# Patient Record
Sex: Male | Born: 1950
Health system: Southern US, Community
[De-identification: ages and names within clinical notes are randomized; demographics above are authoritative.]

## PROBLEM LIST (undated history)

## (undated) DIAGNOSIS — I4892 Unspecified atrial flutter: Secondary | ICD-10-CM

## (undated) DIAGNOSIS — Z9889 Other specified postprocedural states: Secondary | ICD-10-CM

## (undated) DIAGNOSIS — I10 Essential (primary) hypertension: Secondary | ICD-10-CM

## (undated) DIAGNOSIS — M109 Gout, unspecified: Secondary | ICD-10-CM

## (undated) DIAGNOSIS — R112 Nausea with vomiting, unspecified: Secondary | ICD-10-CM

## (undated) DIAGNOSIS — E039 Hypothyroidism, unspecified: Secondary | ICD-10-CM

## (undated) DIAGNOSIS — L570 Actinic keratosis: Secondary | ICD-10-CM

## (undated) DIAGNOSIS — E782 Mixed hyperlipidemia: Secondary | ICD-10-CM

## (undated) HISTORY — DX: Hypothyroidism, unspecified: E03.9

## (undated) HISTORY — DX: Unspecified atrial flutter: I48.92

## (undated) HISTORY — DX: Gout, unspecified: M10.9

## (undated) HISTORY — DX: Essential (primary) hypertension: I10

## (undated) HISTORY — DX: Actinic keratosis: L57.0

## (undated) HISTORY — DX: Mixed hyperlipidemia: E78.2

---

## 2016-01-03 DIAGNOSIS — J209 Acute bronchitis, unspecified: Secondary | ICD-10-CM | POA: Diagnosis not present

## 2016-01-03 DIAGNOSIS — R05 Cough: Secondary | ICD-10-CM | POA: Diagnosis not present

## 2016-02-22 DIAGNOSIS — Z23 Encounter for immunization: Secondary | ICD-10-CM | POA: Diagnosis not present

## 2016-04-18 DIAGNOSIS — Z0000 Encounter for general adult medical examination without abnormal findings: Secondary | ICD-10-CM | POA: Diagnosis not present

## 2016-04-18 DIAGNOSIS — L570 Actinic keratosis: Secondary | ICD-10-CM | POA: Diagnosis not present

## 2016-04-18 DIAGNOSIS — E782 Mixed hyperlipidemia: Secondary | ICD-10-CM | POA: Diagnosis not present

## 2016-04-18 DIAGNOSIS — R7989 Other specified abnormal findings of blood chemistry: Secondary | ICD-10-CM | POA: Diagnosis not present

## 2016-04-18 DIAGNOSIS — M109 Gout, unspecified: Secondary | ICD-10-CM | POA: Diagnosis not present

## 2016-04-18 DIAGNOSIS — E039 Hypothyroidism, unspecified: Secondary | ICD-10-CM | POA: Diagnosis not present

## 2016-04-18 DIAGNOSIS — I10 Essential (primary) hypertension: Secondary | ICD-10-CM | POA: Diagnosis not present

## 2016-04-21 DIAGNOSIS — E039 Hypothyroidism, unspecified: Secondary | ICD-10-CM | POA: Diagnosis not present

## 2016-04-21 DIAGNOSIS — I10 Essential (primary) hypertension: Secondary | ICD-10-CM | POA: Diagnosis not present

## 2016-04-21 DIAGNOSIS — M109 Gout, unspecified: Secondary | ICD-10-CM | POA: Diagnosis not present

## 2016-05-26 DIAGNOSIS — J209 Acute bronchitis, unspecified: Secondary | ICD-10-CM | POA: Diagnosis not present

## 2016-05-26 DIAGNOSIS — R05 Cough: Secondary | ICD-10-CM | POA: Diagnosis not present

## 2016-07-08 DIAGNOSIS — S62634A Displaced fracture of distal phalanx of right ring finger, initial encounter for closed fracture: Secondary | ICD-10-CM | POA: Diagnosis not present

## 2016-07-09 DIAGNOSIS — M79644 Pain in right finger(s): Secondary | ICD-10-CM | POA: Diagnosis not present

## 2016-07-09 DIAGNOSIS — M20011 Mallet finger of right finger(s): Secondary | ICD-10-CM | POA: Diagnosis not present

## 2016-07-22 DIAGNOSIS — M79644 Pain in right finger(s): Secondary | ICD-10-CM | POA: Diagnosis not present

## 2016-07-22 DIAGNOSIS — M20011 Mallet finger of right finger(s): Secondary | ICD-10-CM | POA: Diagnosis not present

## 2016-08-17 DIAGNOSIS — M20011 Mallet finger of right finger(s): Secondary | ICD-10-CM | POA: Diagnosis not present

## 2016-08-31 DIAGNOSIS — M79644 Pain in right finger(s): Secondary | ICD-10-CM | POA: Diagnosis not present

## 2016-08-31 DIAGNOSIS — M20011 Mallet finger of right finger(s): Secondary | ICD-10-CM | POA: Diagnosis not present

## 2016-09-12 DIAGNOSIS — M20011 Mallet finger of right finger(s): Secondary | ICD-10-CM | POA: Diagnosis not present

## 2016-09-12 DIAGNOSIS — M79644 Pain in right finger(s): Secondary | ICD-10-CM | POA: Diagnosis not present

## 2016-09-16 DIAGNOSIS — M20011 Mallet finger of right finger(s): Secondary | ICD-10-CM | POA: Diagnosis not present

## 2016-09-19 DIAGNOSIS — M20011 Mallet finger of right finger(s): Secondary | ICD-10-CM | POA: Diagnosis not present

## 2016-09-26 DIAGNOSIS — M20011 Mallet finger of right finger(s): Secondary | ICD-10-CM | POA: Diagnosis not present

## 2016-10-03 DIAGNOSIS — M20011 Mallet finger of right finger(s): Secondary | ICD-10-CM | POA: Diagnosis not present

## 2016-10-10 DIAGNOSIS — M20011 Mallet finger of right finger(s): Secondary | ICD-10-CM | POA: Diagnosis not present

## 2016-10-13 DIAGNOSIS — M20011 Mallet finger of right finger(s): Secondary | ICD-10-CM | POA: Diagnosis not present

## 2016-10-21 DIAGNOSIS — I10 Essential (primary) hypertension: Secondary | ICD-10-CM | POA: Diagnosis not present

## 2016-10-21 DIAGNOSIS — E782 Mixed hyperlipidemia: Secondary | ICD-10-CM | POA: Diagnosis not present

## 2016-10-21 DIAGNOSIS — M109 Gout, unspecified: Secondary | ICD-10-CM | POA: Diagnosis not present

## 2016-10-21 DIAGNOSIS — E039 Hypothyroidism, unspecified: Secondary | ICD-10-CM | POA: Diagnosis not present

## 2016-10-24 DIAGNOSIS — M20011 Mallet finger of right finger(s): Secondary | ICD-10-CM | POA: Diagnosis not present

## 2016-10-25 DIAGNOSIS — E782 Mixed hyperlipidemia: Secondary | ICD-10-CM | POA: Diagnosis not present

## 2016-10-25 DIAGNOSIS — Z1159 Encounter for screening for other viral diseases: Secondary | ICD-10-CM | POA: Diagnosis not present

## 2016-10-25 DIAGNOSIS — M109 Gout, unspecified: Secondary | ICD-10-CM | POA: Diagnosis not present

## 2016-10-25 DIAGNOSIS — Z23 Encounter for immunization: Secondary | ICD-10-CM | POA: Diagnosis not present

## 2016-10-25 DIAGNOSIS — I10 Essential (primary) hypertension: Secondary | ICD-10-CM | POA: Diagnosis not present

## 2016-10-25 DIAGNOSIS — Z0000 Encounter for general adult medical examination without abnormal findings: Secondary | ICD-10-CM | POA: Diagnosis not present

## 2016-10-25 DIAGNOSIS — E039 Hypothyroidism, unspecified: Secondary | ICD-10-CM | POA: Diagnosis not present

## 2016-10-25 DIAGNOSIS — Z125 Encounter for screening for malignant neoplasm of prostate: Secondary | ICD-10-CM | POA: Diagnosis not present

## 2016-11-23 DIAGNOSIS — M20011 Mallet finger of right finger(s): Secondary | ICD-10-CM | POA: Diagnosis not present

## 2017-03-28 DIAGNOSIS — H524 Presbyopia: Secondary | ICD-10-CM | POA: Diagnosis not present

## 2017-03-28 DIAGNOSIS — H43813 Vitreous degeneration, bilateral: Secondary | ICD-10-CM | POA: Diagnosis not present

## 2017-04-10 DIAGNOSIS — Z23 Encounter for immunization: Secondary | ICD-10-CM | POA: Diagnosis not present

## 2017-05-22 DIAGNOSIS — L821 Other seborrheic keratosis: Secondary | ICD-10-CM | POA: Diagnosis not present

## 2017-09-20 DIAGNOSIS — J069 Acute upper respiratory infection, unspecified: Secondary | ICD-10-CM | POA: Diagnosis not present

## 2017-10-05 DIAGNOSIS — K640 First degree hemorrhoids: Secondary | ICD-10-CM | POA: Diagnosis not present

## 2017-10-05 DIAGNOSIS — D126 Benign neoplasm of colon, unspecified: Secondary | ICD-10-CM | POA: Diagnosis not present

## 2017-10-05 DIAGNOSIS — K5730 Diverticulosis of large intestine without perforation or abscess without bleeding: Secondary | ICD-10-CM | POA: Diagnosis not present

## 2017-10-05 DIAGNOSIS — Z1211 Encounter for screening for malignant neoplasm of colon: Secondary | ICD-10-CM | POA: Diagnosis not present

## 2017-10-10 DIAGNOSIS — D126 Benign neoplasm of colon, unspecified: Secondary | ICD-10-CM | POA: Diagnosis not present

## 2017-11-28 DIAGNOSIS — Z23 Encounter for immunization: Secondary | ICD-10-CM | POA: Diagnosis not present

## 2017-11-28 DIAGNOSIS — I10 Essential (primary) hypertension: Secondary | ICD-10-CM | POA: Diagnosis not present

## 2017-11-28 DIAGNOSIS — Z125 Encounter for screening for malignant neoplasm of prostate: Secondary | ICD-10-CM | POA: Diagnosis not present

## 2017-11-28 DIAGNOSIS — Z1159 Encounter for screening for other viral diseases: Secondary | ICD-10-CM | POA: Diagnosis not present

## 2017-11-28 DIAGNOSIS — E039 Hypothyroidism, unspecified: Secondary | ICD-10-CM | POA: Diagnosis not present

## 2017-11-28 DIAGNOSIS — E782 Mixed hyperlipidemia: Secondary | ICD-10-CM | POA: Diagnosis not present

## 2017-11-28 DIAGNOSIS — Z0000 Encounter for general adult medical examination without abnormal findings: Secondary | ICD-10-CM | POA: Diagnosis not present

## 2017-11-28 DIAGNOSIS — M109 Gout, unspecified: Secondary | ICD-10-CM | POA: Diagnosis not present

## 2017-12-05 DIAGNOSIS — Z125 Encounter for screening for malignant neoplasm of prostate: Secondary | ICD-10-CM | POA: Diagnosis not present

## 2017-12-05 DIAGNOSIS — Z1389 Encounter for screening for other disorder: Secondary | ICD-10-CM | POA: Diagnosis not present

## 2017-12-05 DIAGNOSIS — Z23 Encounter for immunization: Secondary | ICD-10-CM | POA: Diagnosis not present

## 2017-12-05 DIAGNOSIS — I10 Essential (primary) hypertension: Secondary | ICD-10-CM | POA: Diagnosis not present

## 2017-12-05 DIAGNOSIS — E782 Mixed hyperlipidemia: Secondary | ICD-10-CM | POA: Diagnosis not present

## 2017-12-05 DIAGNOSIS — E039 Hypothyroidism, unspecified: Secondary | ICD-10-CM | POA: Diagnosis not present

## 2017-12-05 DIAGNOSIS — Z0000 Encounter for general adult medical examination without abnormal findings: Secondary | ICD-10-CM | POA: Diagnosis not present

## 2017-12-05 DIAGNOSIS — M109 Gout, unspecified: Secondary | ICD-10-CM | POA: Diagnosis not present

## 2018-03-16 DIAGNOSIS — Z23 Encounter for immunization: Secondary | ICD-10-CM | POA: Diagnosis not present

## 2018-12-17 DIAGNOSIS — I10 Essential (primary) hypertension: Secondary | ICD-10-CM | POA: Diagnosis not present

## 2018-12-17 DIAGNOSIS — E782 Mixed hyperlipidemia: Secondary | ICD-10-CM | POA: Diagnosis not present

## 2018-12-17 DIAGNOSIS — Z0000 Encounter for general adult medical examination without abnormal findings: Secondary | ICD-10-CM | POA: Diagnosis not present

## 2018-12-17 DIAGNOSIS — M109 Gout, unspecified: Secondary | ICD-10-CM | POA: Diagnosis not present

## 2018-12-17 DIAGNOSIS — E039 Hypothyroidism, unspecified: Secondary | ICD-10-CM | POA: Diagnosis not present

## 2019-02-05 DIAGNOSIS — H43813 Vitreous degeneration, bilateral: Secondary | ICD-10-CM | POA: Diagnosis not present

## 2019-02-05 DIAGNOSIS — H524 Presbyopia: Secondary | ICD-10-CM | POA: Diagnosis not present

## 2019-02-05 DIAGNOSIS — H2513 Age-related nuclear cataract, bilateral: Secondary | ICD-10-CM | POA: Diagnosis not present

## 2019-03-12 DIAGNOSIS — E039 Hypothyroidism, unspecified: Secondary | ICD-10-CM | POA: Diagnosis not present

## 2019-03-12 DIAGNOSIS — I10 Essential (primary) hypertension: Secondary | ICD-10-CM | POA: Diagnosis not present

## 2019-03-12 DIAGNOSIS — E782 Mixed hyperlipidemia: Secondary | ICD-10-CM | POA: Diagnosis not present

## 2019-03-16 DIAGNOSIS — Z23 Encounter for immunization: Secondary | ICD-10-CM | POA: Diagnosis not present

## 2019-03-18 DIAGNOSIS — M79621 Pain in right upper arm: Secondary | ICD-10-CM | POA: Diagnosis not present

## 2019-03-18 DIAGNOSIS — M7541 Impingement syndrome of right shoulder: Secondary | ICD-10-CM | POA: Diagnosis not present

## 2019-04-22 DIAGNOSIS — M79621 Pain in right upper arm: Secondary | ICD-10-CM | POA: Diagnosis not present

## 2019-08-02 DIAGNOSIS — M25511 Pain in right shoulder: Secondary | ICD-10-CM | POA: Diagnosis not present

## 2019-08-03 DIAGNOSIS — M25511 Pain in right shoulder: Secondary | ICD-10-CM | POA: Diagnosis not present

## 2019-08-20 DIAGNOSIS — E782 Mixed hyperlipidemia: Secondary | ICD-10-CM | POA: Diagnosis not present

## 2019-08-20 DIAGNOSIS — I10 Essential (primary) hypertension: Secondary | ICD-10-CM | POA: Diagnosis not present

## 2019-08-20 DIAGNOSIS — E039 Hypothyroidism, unspecified: Secondary | ICD-10-CM | POA: Diagnosis not present

## 2019-08-22 DIAGNOSIS — I4892 Unspecified atrial flutter: Secondary | ICD-10-CM | POA: Diagnosis not present

## 2019-08-22 DIAGNOSIS — R0602 Shortness of breath: Secondary | ICD-10-CM | POA: Diagnosis not present

## 2019-09-11 DIAGNOSIS — E039 Hypothyroidism, unspecified: Secondary | ICD-10-CM | POA: Diagnosis not present

## 2019-09-11 DIAGNOSIS — I10 Essential (primary) hypertension: Secondary | ICD-10-CM | POA: Diagnosis not present

## 2019-09-11 DIAGNOSIS — E782 Mixed hyperlipidemia: Secondary | ICD-10-CM | POA: Diagnosis not present

## 2019-09-16 ENCOUNTER — Encounter: Payer: Self-pay | Admitting: Internal Medicine

## 2019-09-16 ENCOUNTER — Other Ambulatory Visit: Payer: Self-pay

## 2019-09-16 ENCOUNTER — Ambulatory Visit (INDEPENDENT_AMBULATORY_CARE_PROVIDER_SITE_OTHER): Payer: Medicare Other | Admitting: Internal Medicine

## 2019-09-16 VITALS — BP 134/88 | HR 81 | Ht 66.0 in | Wt 194.0 lb

## 2019-09-16 DIAGNOSIS — I4891 Unspecified atrial fibrillation: Secondary | ICD-10-CM

## 2019-09-16 NOTE — Patient Instructions (Signed)
Medication Instructions:  No changes *If you need a refill on your cardiac medications before your next appointment, please call your pharmacy*   Lab Work: none If you have labs (blood work) drawn today and your tests are completely normal, you will receive your results only by: Marland Kitchen MyChart Message (if you have MyChart) OR . A paper copy in the mail If you have any lab test that is abnormal or we need to change your treatment, we will call you to review the results.   Testing/Procedures: Your physician has requested that you have an echocardiogram. Echocardiography is a painless test that uses sound waves to create images of your heart. It provides your doctor with information about the size and shape of your heart and how well your heart's chambers and valves are working. This procedure takes approximately one hour. There are no restrictions for this procedure.   Follow-Up: At [facility], you and your health needs are our priority.  As part of our continuing mission to provide you with exceptional heart care, we have created designated Provider Care Teams.  These Care Teams include your primary Cardiologist (physician) and Advanced Practice Providers (APPs -  Physician Assistants and Nurse Practitioners) who all work together to provide you with the care you need, when you need it.  We recommend signing up for the patient portal called "MyChart".  Sign up information is provided on this After Visit Summary.  MyChart is used to connect with patients for Virtual Visits (Telemedicine).  Patients are able to view lab/test results, encounter notes, upcoming appointments, etc.  Non-urgent messages can be sent to your provider as well.   To learn more about what you can do with MyChart, go to [email or web address].    Other Instructions

## 2019-09-16 NOTE — Progress Notes (Signed)
Cardiology Office Note   Date:  09/16/2019   ID:  Eddie Thompson, DOB May 31, 1951, MRN [id number]  PCP:  [name], MD  Cardiologist:   Dorris Carnes, MD   Pt referred by Myriam Jacobson for evaluation of atrial fib and dyspnea    History of Present Illness: Eddie Thompson is a 68 y.o. male who is followed by C Melinda Crutch Seen in Feb   Found t obe in atrial fibrillation  The pt is active  He says over the past several months he has noted that he gets more winded with activity    When plays with grandchild he has to stop to catch his breath    Denies CP   No dizziness No palpitations    Current Meds  Medication Sig  . allopurinol (ZYLOPRIM) 100 MG tablet Take 100 mg by mouth daily.  Marland Kitchen apixaban (ELIQUIS) 5 MG TABS tablet Take 5 mg by mouth 2 (two) times daily.  Marland Kitchen atorvastatin (LIPITOR) 40 MG tablet Take 40 mg by mouth daily.  . famotidine (PEPCID) 10 MG tablet Take 10 mg by mouth daily.  Marland Kitchen levothyroxine (SYNTHROID) 100 MCG tablet Take 100 mcg by mouth daily before breakfast.  . LOSARTAN POTASSIUM-HCTZ PO Take 12.5 mg by mouth daily. 100-12.5 mg  . Metoprolol Succinate 100 MG CS24 Take by mouth.  . Multiple Vitamin (MULTIVITAMIN) capsule Take 1 capsule by mouth daily.  . Omega-3 Fatty Acids (FISH OIL) 1000 MG CAPS Take 1,000 mg by mouth daily.  . Pyridoxine HCl (VITAMIN B6 PO) Take by mouth.     Allergies:   Niacin, Other, Dolobid [diflunisal], and Tetracyclines & related   Past Medical History:  Diagnosis Date  . Actinic keratosis   . Atrial flutter ([place])   . Gout   . HTN (hypertension)   . Hypothyroidism   . Mixed hyperlipidemia       Social History:  The patient  reports that he has never smoked. He has never used smokeless tobacco. He reports that he does not drink alcohol or use drugs.   Family History:  The patient's family history is not on file.    ROS:  Please see the history of present illness. All other systems are reviewed and  Negative to the  above problem except as noted.    PHYSICAL EXAM: VS:  BP 134/88   Pulse 81   Ht 5\' 6"  (1.676 m)   Wt 194 lb (88 kg)   BMI 31.31 kg/m   GEN: Obese 68yo, in no acute distress  HEENT: normal  Neck: no JVD, carotid bruits Cardiac: Irreg irreg   No murmurs  No LE edema  Respiratory:  clear to auscultation bilaterally, normal work of breathing GI: soft, nontender, nondistended, + BS  No hepatomegaly  MS: no deformity Moving all extremities   Skin: warm and dry, no rash Neuro:  Strength and sensation are intact Psych: euthymic mood, full affect   EKG:  EKG is ordered today  Atrial fibrillation   81 bpm     Lipid Panel No results found for: CHOL, TRIG, HDL, CHOLHDL, VLDL, LDLCALC, LDLDIRECT    Wt Readings from Last 3 Encounters:  09/16/19 194 lb (88 kg)      ASSESSMENT AND PLAN:  1Atrial fibrillation   Recently diagnosed  The pt denies palpitations but does note dyspnea.   ? If related to afib      Will set up for echo to evaluate LV size, function, LA /  RA size Keep on Toprol and Eliquis   2  Dyspnea   May be related to afib  Need to also think of CAD    ? CT   ? Stress test   3  HL  Lipids are very good in June 2019  LDL 45  On statin  4  HTN  BP is up a little   FOllow  Current medicines are reviewed at length with the patient today.  The patient does not have concerns regarding medicines.  Signed, Dorris Carnes, MD  09/16/2019 9:18 AM    [address], [place], [place]  [address] Phone: [phone]; Fax: [phone]

## 2019-10-03 ENCOUNTER — Other Ambulatory Visit: Payer: Self-pay

## 2019-10-03 ENCOUNTER — Ambulatory Visit (HOSPITAL_COMMUNITY): Payer: Medicare Other | Attending: Cardiology

## 2019-10-03 DIAGNOSIS — I4891 Unspecified atrial fibrillation: Secondary | ICD-10-CM | POA: Diagnosis not present

## 2019-10-10 ENCOUNTER — Other Ambulatory Visit: Payer: Medicare Other | Admitting: *Deleted

## 2019-10-10 ENCOUNTER — Other Ambulatory Visit: Payer: Self-pay

## 2019-10-10 ENCOUNTER — Telehealth: Payer: Self-pay | Admitting: *Deleted

## 2019-10-10 DIAGNOSIS — R0602 Shortness of breath: Secondary | ICD-10-CM

## 2019-10-10 NOTE — Telephone Encounter (Signed)
-----   Message from Fay Records, MD sent at 10/09/2019  4:57 PM EDT ----- Bonne Dolores to call pt  No answer  I did not see result in my desk top "To Do' until today.  Echo showed normal LV function.   The RV which pumps blood to the lungs appears mildly dilated and function MAY be mildly down   It is difficult to image compared to Left ventricle. With his symptoms of shortness of breath I would recomm a chest CT (r/o chronic PE, assess for lung changes that could explain.)  Current echo cannot get extimate of pulmonary pressuress.

## 2019-10-10 NOTE — Telephone Encounter (Signed)
Spoke with the patient. He is aware of recommendation for chest ct. Spoke with Lattie Haw in [place] -pt should have cta /pe protocol. He prefers [facility]. Will come today for BMET.

## 2019-10-11 LAB — BASIC METABOLIC PANEL
BUN/Creatinine Ratio: 12 (ref 10–24)
BUN: 19 mg/dL (ref 8–27)
CO2: 27 mmol/L (ref 20–29)
Calcium: 9.4 mg/dL (ref 8.6–10.2)
Chloride: 103 mmol/L (ref 96–106)
Creatinine, Ser: 1.54 mg/dL — ABNORMAL HIGH (ref 0.76–1.27)
GFR calc Af Amer: 53 mL/min/{1.73_m2} — ABNORMAL LOW (ref >59)
GFR calc non Af Amer: 46 mL/min/{1.73_m2} — ABNORMAL LOW (ref >59)
Glucose: 88 mg/dL (ref 65–99)
Potassium: 3.8 mmol/L (ref 3.5–5.2)
Sodium: 143 mmol/L (ref 134–144)

## 2019-10-15 ENCOUNTER — Other Ambulatory Visit: Payer: Self-pay | Admitting: Pharmacist

## 2019-10-15 MED ORDER — APIXABAN 5 MG PO TABS: 5.0000 mg | ORAL_TABLET | Freq: Two times a day (BID) | ORAL | 5 refills | Status: DC

## 2019-10-15 NOTE — Progress Notes (Signed)
Age 69, weight 88kg, SCr, SCr 1.54 on 10/10/19, afib indication, will send in refill

## 2019-10-25 ENCOUNTER — Other Ambulatory Visit: Payer: Medicare Other

## 2019-10-28 ENCOUNTER — Ambulatory Visit
Admission: RE | Admit: 2019-10-28 | Discharge: 2019-10-28 | Disposition: A | Discharge status: Home / Self Care | Payer: Medicare Other | Source: Ambulatory Visit | Attending: Internal Medicine | Admitting: Internal Medicine

## 2019-10-28 ENCOUNTER — Other Ambulatory Visit: Payer: Self-pay

## 2019-10-28 DIAGNOSIS — R0602 Shortness of breath: Secondary | ICD-10-CM | POA: Diagnosis not present

## 2019-10-28 MED ORDER — IOPAMIDOL (ISOVUE-370) INJECTION 76%
75.0000 mL | Freq: Once | INTRAVENOUS | Status: AC | PRN
  Administered 2019-10-28: 75 mL via INTRAVENOUS

## 2019-11-07 ENCOUNTER — Other Ambulatory Visit: Payer: Self-pay

## 2019-11-07 ENCOUNTER — Ambulatory Visit (INDEPENDENT_AMBULATORY_CARE_PROVIDER_SITE_OTHER): Payer: Medicare Other | Admitting: Internal Medicine

## 2019-11-07 ENCOUNTER — Encounter: Payer: Self-pay | Admitting: Internal Medicine

## 2019-11-07 ENCOUNTER — Encounter: Payer: Self-pay | Admitting: *Deleted

## 2019-11-07 VITALS — BP 128/72 | HR 73 | Ht 66.0 in | Wt 192.6 lb

## 2019-11-07 DIAGNOSIS — I4811 Longstanding persistent atrial fibrillation: Secondary | ICD-10-CM

## 2019-11-07 DIAGNOSIS — Z01812 Encounter for preprocedural laboratory examination: Secondary | ICD-10-CM | POA: Diagnosis not present

## 2019-11-07 LAB — CBC
Hematocrit: 48.2 % (ref 37.5–51.0)
Hemoglobin: 17.3 g/dL (ref 13.0–17.7)
MCH: 34.1 pg — ABNORMAL HIGH (ref 26.6–33.0)
MCHC: 35.9 g/dL — ABNORMAL HIGH (ref 31.5–35.7)
MCV: 95 fL (ref 79–97)
Platelets: 242 10*3/uL (ref 150–450)
RBC: 5.08 x10E6/uL (ref 4.14–5.80)
RDW: 13.1 % (ref 11.6–15.4)
WBC: 8 10*3/uL (ref 3.4–10.8)

## 2019-11-07 LAB — BASIC METABOLIC PANEL
BUN/Creatinine Ratio: 10 (ref 10–24)
BUN: 13 mg/dL (ref 8–27)
CO2: 28 mmol/L (ref 20–29)
Calcium: 9.9 mg/dL (ref 8.6–10.2)
Chloride: 101 mmol/L (ref 96–106)
Creatinine, Ser: 1.29 mg/dL — ABNORMAL HIGH (ref 0.76–1.27)
GFR calc Af Amer: 65 mL/min/{1.73_m2} (ref >59)
GFR calc non Af Amer: 57 mL/min/{1.73_m2} — ABNORMAL LOW (ref >59)
Glucose: 102 mg/dL — ABNORMAL HIGH (ref 65–99)
Potassium: 4.1 mmol/L (ref 3.5–5.2)
Sodium: 143 mmol/L (ref 134–144)

## 2019-11-07 NOTE — Progress Notes (Signed)
Cardiology Office Note   Date:  11/07/2019   ID:  JACHOB HAYRE, DOB 01/05/1951, MRN [id number]  PCP:  [name], MD  Cardiologist:   Dorris Carnes, MD   Pt returns for eval of afib and DOE      History of Present Illness: Eddie Thompson is a 69 y.o. male who is followed by C Melinda Crutch Seen in Feb   Found t obe in atrial fibrillation I saw him in March   He said he was active but noticed over several months increased SOB with acitvity      No Cp    Echo done  LVEF and RVEF were normal   Atria enlarged    CT of chest done   No signif lung findings   No  PE   Scattered calcifications   He denies Dizziness, no CP (ever)   Still SOB when with grandhchild     Current Meds  Medication Sig  . allopurinol (ZYLOPRIM) 100 MG tablet Take 100 mg by mouth daily.  Marland Kitchen apixaban (ELIQUIS) 5 MG TABS tablet Take 1 tablet (5 mg total) by mouth 2 (two) times daily.  Marland Kitchen atorvastatin (LIPITOR) 40 MG tablet Take 40 mg by mouth daily.  . famotidine (PEPCID) 10 MG tablet Take 10 mg by mouth daily.  Marland Kitchen levothyroxine (SYNTHROID) 100 MCG tablet Take 100 mcg by mouth daily before breakfast.  . LOSARTAN POTASSIUM-HCTZ PO Take 12.5 mg by mouth daily. 100-12.5 mg  . Metoprolol Succinate 100 MG CS24 Take by mouth.  . Multiple Vitamin (MULTIVITAMIN) capsule Take 1 capsule by mouth daily.  . Omega-3 Fatty Acids (FISH OIL) 1000 MG CAPS Take 1,000 mg by mouth daily.  . Pyridoxine HCl (VITAMIN B6 PO) Take by mouth.     Allergies:   Niacin, Other, Dolobid [diflunisal], and Tetracyclines & related   Past Medical History:  Diagnosis Date  . Actinic keratosis   . Atrial flutter ([place])   . Gout   . HTN (hypertension)   . Hypothyroidism   . Mixed hyperlipidemia       Social History:  The patient  reports that he has never smoked. He has never used smokeless tobacco. He reports that he does not drink alcohol or use drugs.   Family History:  The patient's family history is not on file.    ROS:   Please see the history of present illness. All other systems are reviewed and  Negative to the above problem except as noted.    PHYSICAL EXAM: VS:  BP 128/72   Pulse 73   Ht 5\' 6"  (1.676 m)   Wt 192 lb 9.6 oz (87.4 kg)   BMI 31.09 kg/m   GEN: Obese 68yo, in no acute distress  HEENT: normal  Neck: no JVD, carotid bruits Cardiac: Irreg irreg   No murmurs  No  edema in legs   Respiratory:  clear to auscultation bilaterally, normal work of breathing GI: soft, nontender, nondistended, + BS  No hepatomegaly  MS: no deformity Moving all extremities   Skin: warm and dry, no rash Neuro:  Strength and sensation are intact Psych: euthymic mood, full affect   EKG:  EKG is ordered today  Atrial fibrillation   73 bpm    Lipid Panel No results found for: CHOL, TRIG, HDL, CHOLHDL, VLDL, LDLCALC, LDLDIRECT    Wt Readings from Last 3 Encounters:  11/07/19 192 lb 9.6 oz (87.4 kg)  09/16/19 194 lb (88 kg)  ASSESSMENT AND PLAN:  1Atrial fibrillation   Remains rate controlled   IT was picked up earlier this year    Sitting he is comfortable   QUestion though if with activity it is responsible for his dyspnea, giving out (when runs around with grandchild) Echo shows normal LV function   Atria are enlarged some  For now I would pursue cardioversion and then close follow up to see how he feels   If feels better then would pursue rhythm control if he goes back into afib     Keep on anticoagulation   3  HL  Lipids are very good in June 2019  LDL 45  On statin  4  HTN  BP is OK  COntinue to follow    Current medicines are reviewed at length with the patient today.  The patient does not have concerns regarding medicines.  Signed, Dorris Carnes, MD  11/07/2019 10:20 AM    [facility] [place], [place], [place]  [address] Phone: [phone]; Fax: [phone]

## 2019-11-07 NOTE — Patient Instructions (Addendum)
Medication Instructions:  Your physician recommends that you continue on your current medications as directed. Please refer to the Current Medication list given to you today.  *If you need a refill on your cardiac medications before your next appointment, please call your pharmacy*   Lab Work: TODAY CBC AND BMET If you have labs (blood work) drawn today and your tests are completely normal, you will receive your results only by: Marland Kitchen MyChart Message (if you have MyChart) OR . A paper copy in the mail If you have any lab test that is abnormal or we need to change your treatment, we will call you to review the results.   Testing/Procedures:  Your physician has recommended that you have a Cardioversion (DCCV). Electrical Cardioversion uses a jolt of electricity to your heart either through paddles or wired patches attached to your chest. This is a controlled, usually prescheduled, procedure. Defibrillation is done under light anesthesia in the hospital, and you usually go home the day of the procedure. This is done to get your heart back into a normal rhythm. You are not awake for the procedure. Please see the instruction sheet given to you today.    Follow-Up: At [facility], you and your health needs are our priority.  As part of our continuing mission to provide you with exceptional heart care, we have created designated Provider Care Teams.  These Care Teams include your primary Cardiologist (physician) and Advanced Practice Providers (APPs -  Physician Assistants and Nurse Practitioners) who all work together to provide you with the care you need, when you need it.  We recommend signing up for the patient portal called "MyChart".  Sign up information is provided on this After Visit Summary.  MyChart is used to connect with patients for Virtual Visits (Telemedicine).  Patients are able to view lab/test results, encounter notes, upcoming appointments, etc.  Non-urgent messages can be sent to  your provider as well.   To learn more about what you can do with MyChart, go to [email or web address].    Your next appointment:   4 week(s)  The format for your next appointment:   In Person  Provider:   You may see No primary care provider on file.3 WEEKS S/P DCCV or one of the following Advanced Practice Providers on your designated Care Team:    Richardson Dopp, PA-C  [place], [place]    Other Instructions   You are scheduled for a Cardioversion on 11/21/19 with Dr. Stanford Breed.  Please arrive at the [facility] (Main Entrance A) at [facility]: [address] [place] [address] [place], [place] [address] at [place] hour prior to procedure unless lab work is needed; if lab work is needed arrive 1.5 hours ahead)  DIET: Nothing to eat or drink after midnight except a sip of water with medications (see medication instructions below)  Medication Instructions:   Continue your anticoagulant: ELIQUIS You will need to continue your anticoagulant after your procedure until you  are told by your  Provider that it is safe to stop   Labs: If patient is on Coumadin, patient needs pt/INR, CBC, BMET within 3 days (No pt/INR needed for patients taking Xarelto, Eliquis, Pradaxa) For patients receiving anesthesia for TEE and all Cardioversion patients: BMET, CBC within 1 week  Come to: [place] 11/07/19 (Lab option #1) Come to the lab at [facility] between the hours of 8:00 am and 4:30 pm. You do not have to be fasting. (Lab option #  2) Lab at an alternate location (Lab option #3) your lab work will be done at the hospital prior to your procedure - you will need to arrive 1  hours ahead of your procedure  You must have a responsible person to drive you home and stay in the waiting area during your procedure. Failure to do so could result in cancellation.  Bring your insurance cards.  *Special Note: Every effort is made to have your procedure done on time. Occasionally there are  emergencies that occur at the hospital that may cause delays. Please be patient if a delay does occur.

## 2019-11-07 NOTE — H&P (View-Only) (Signed)
Cardiology Office Note   Date:  11/07/2019   ID:  AEDIN SAUCER, DOB March 18, 1951, MRN [id number]  PCP:  [name], MD  Cardiologist:   Dorris Carnes, MD   Pt returns for eval of afib and DOE      History of Present Illness: Eddie Thompson is a 69 y.o. male who is followed by C Melinda Crutch Seen in Feb   Found t obe in atrial fibrillation I saw him in March   He said he was active but noticed over several months increased SOB with acitvity      No Cp    Echo done  LVEF and RVEF were normal   Atria enlarged    CT of chest done   No signif lung findings   No  PE   Scattered calcifications   He denies Dizziness, no CP (ever)   Still SOB when with grandhchild     Current Meds  Medication Sig  . allopurinol (ZYLOPRIM) 100 MG tablet Take 100 mg by mouth daily.  Marland Kitchen apixaban (ELIQUIS) 5 MG TABS tablet Take 1 tablet (5 mg total) by mouth 2 (two) times daily.  Marland Kitchen atorvastatin (LIPITOR) 40 MG tablet Take 40 mg by mouth daily.  . famotidine (PEPCID) 10 MG tablet Take 10 mg by mouth daily.  Marland Kitchen levothyroxine (SYNTHROID) 100 MCG tablet Take 100 mcg by mouth daily before breakfast.  . LOSARTAN POTASSIUM-HCTZ PO Take 12.5 mg by mouth daily. 100-12.5 mg  . Metoprolol Succinate 100 MG CS24 Take by mouth.  . Multiple Vitamin (MULTIVITAMIN) capsule Take 1 capsule by mouth daily.  . Omega-3 Fatty Acids (FISH OIL) 1000 MG CAPS Take 1,000 mg by mouth daily.  . Pyridoxine HCl (VITAMIN B6 PO) Take by mouth.     Allergies:   Niacin, Other, Dolobid [diflunisal], and Tetracyclines & related   Past Medical History:  Diagnosis Date  . Actinic keratosis   . Atrial flutter ([place])   . Gout   . HTN (hypertension)   . Hypothyroidism   . Mixed hyperlipidemia       Social History:  The patient  reports that he has never smoked. He has never used smokeless tobacco. He reports that he does not drink alcohol or use drugs.   Family History:  The patient's family history is not on file.    ROS:   Please see the history of present illness. All other systems are reviewed and  Negative to the above problem except as noted.    PHYSICAL EXAM: VS:  BP 128/72   Pulse 73   Ht 5\' 6"  (1.676 m)   Wt 192 lb 9.6 oz (87.4 kg)   BMI 31.09 kg/m   GEN: Obese 68yo, in no acute distress  HEENT: normal  Neck: no JVD, carotid bruits Cardiac: Irreg irreg   No murmurs  No  edema in legs   Respiratory:  clear to auscultation bilaterally, normal work of breathing GI: soft, nontender, nondistended, + BS  No hepatomegaly  MS: no deformity Moving all extremities   Skin: warm and dry, no rash Neuro:  Strength and sensation are intact Psych: euthymic mood, full affect   EKG:  EKG is ordered today  Atrial fibrillation   73 bpm    Lipid Panel No results found for: CHOL, TRIG, HDL, CHOLHDL, VLDL, LDLCALC, LDLDIRECT    Wt Readings from Last 3 Encounters:  11/07/19 192 lb 9.6 oz (87.4 kg)  09/16/19 194 lb (88 kg)  ASSESSMENT AND PLAN:  1Atrial fibrillation   Remains rate controlled   IT was picked up earlier this year    Sitting he is comfortable   QUestion though if with activity it is responsible for his dyspnea, giving out (when runs around with grandchild) Echo shows normal LV function   Atria are enlarged some  For now I would pursue cardioversion and then close follow up to see how he feels   If feels better then would pursue rhythm control if he goes back into afib     Keep on anticoagulation   3  HL  Lipids are very good in June 2019  LDL 45  On statin  4  HTN  BP is OK  COntinue to follow    Current medicines are reviewed at length with the patient today.  The patient does not have concerns regarding medicines.  Signed, Dorris Carnes, MD  11/07/2019 10:20 AM    [facility] [place], [place], [place]  [address] Phone: [phone]; Fax: [phone]

## 2019-11-13 ENCOUNTER — Other Ambulatory Visit (HOSPITAL_COMMUNITY)
Admission: RE | Admit: 2019-11-13 | Discharge: 2019-11-13 | Disposition: A | Discharge status: Home / Self Care | Payer: Medicare Other | Source: Ambulatory Visit | Attending: Cardiology | Admitting: Cardiology

## 2019-11-13 DIAGNOSIS — Z01812 Encounter for preprocedural laboratory examination: Secondary | ICD-10-CM | POA: Insufficient documentation

## 2019-11-13 DIAGNOSIS — Z20822 Contact with and (suspected) exposure to covid-19: Secondary | ICD-10-CM | POA: Insufficient documentation

## 2019-11-14 LAB — SARS CORONAVIRUS 2 (TAT 6-24 HRS): SARS Coronavirus 2: NEGATIVE

## 2019-11-15 ENCOUNTER — Ambulatory Visit (HOSPITAL_COMMUNITY)
Admission: RE | Admit: 2019-11-15 | Discharge: 2019-11-15 | Disposition: A | Discharge status: Home / Self Care | Payer: Medicare Other | Attending: Cardiology | Admitting: Cardiology

## 2019-11-15 ENCOUNTER — Ambulatory Visit (HOSPITAL_COMMUNITY): Payer: Medicare Other | Admitting: Certified Registered"

## 2019-11-15 ENCOUNTER — Encounter (HOSPITAL_COMMUNITY): Payer: Self-pay | Admitting: Cardiology

## 2019-11-15 ENCOUNTER — Encounter (HOSPITAL_COMMUNITY)
Admission: RE | Disposition: A | Discharge status: Home / Self Care | Payer: Self-pay | Source: Home / Self Care | Attending: Cardiology

## 2019-11-15 DIAGNOSIS — Z6831 Body mass index (BMI) 31.0-31.9, adult: Secondary | ICD-10-CM | POA: Insufficient documentation

## 2019-11-15 DIAGNOSIS — E039 Hypothyroidism, unspecified: Secondary | ICD-10-CM | POA: Insufficient documentation

## 2019-11-15 DIAGNOSIS — Z79890 Hormone replacement therapy (postmenopausal): Secondary | ICD-10-CM | POA: Insufficient documentation

## 2019-11-15 DIAGNOSIS — E669 Obesity, unspecified: Secondary | ICD-10-CM | POA: Insufficient documentation

## 2019-11-15 DIAGNOSIS — Z79899 Other long term (current) drug therapy: Secondary | ICD-10-CM | POA: Insufficient documentation

## 2019-11-15 DIAGNOSIS — Z7901 Long term (current) use of anticoagulants: Secondary | ICD-10-CM | POA: Insufficient documentation

## 2019-11-15 DIAGNOSIS — E782 Mixed hyperlipidemia: Secondary | ICD-10-CM | POA: Insufficient documentation

## 2019-11-15 DIAGNOSIS — I4892 Unspecified atrial flutter: Secondary | ICD-10-CM | POA: Insufficient documentation

## 2019-11-15 DIAGNOSIS — I4891 Unspecified atrial fibrillation: Secondary | ICD-10-CM

## 2019-11-15 DIAGNOSIS — R0602 Shortness of breath: Secondary | ICD-10-CM | POA: Diagnosis not present

## 2019-11-15 DIAGNOSIS — M109 Gout, unspecified: Secondary | ICD-10-CM | POA: Diagnosis not present

## 2019-11-15 DIAGNOSIS — I10 Essential (primary) hypertension: Secondary | ICD-10-CM | POA: Insufficient documentation

## 2019-11-15 HISTORY — PX: CARDIOVERSION: SHX1299

## 2019-11-15 SURGERY — CARDIOVERSION
Anesthesia: General

## 2019-11-15 MED ORDER — PROPOFOL 10 MG/ML IV BOLUS
INTRAVENOUS | Status: DC | PRN
  Administered 2019-11-15: 20 mg via INTRAVENOUS
  Administered 2019-11-15: 50 mg via INTRAVENOUS

## 2019-11-15 MED ORDER — SODIUM CHLORIDE 0.9 % IV SOLN
INTRAVENOUS | Status: AC | PRN
  Administered 2019-11-15: 500 mL via INTRAMUSCULAR

## 2019-11-15 MED ORDER — LIDOCAINE 2% (20 MG/ML) 5 ML SYRINGE
INTRAMUSCULAR | Status: DC | PRN
  Administered 2019-11-15: 50 mg via INTRAVENOUS

## 2019-11-15 NOTE — Anesthesia Postprocedure Evaluation (Signed)
Anesthesia Post Note  Patient: Eddie Thompson  Procedure(s) Performed: CARDIOVERSION (N/A )     Patient location during evaluation: PACU Anesthesia Type: General Level of consciousness: awake and alert and oriented Pain management: pain level controlled Vital Signs Assessment: post-procedure vital signs reviewed and stable Respiratory status: spontaneous breathing, nonlabored ventilation and respiratory function stable Cardiovascular status: blood pressure returned to baseline and stable Postop Assessment: no apparent nausea or vomiting Anesthetic complications: no    Last Vitals:  Vitals:   11/15/19 0911 11/15/19 0940  BP: (!) 132/98 101/70  Pulse: 77 62  Resp: 17 18  Temp: 37.1 C   SpO2: 100% 95%    Last Pain:  Vitals:   11/15/19 0911  TempSrc: Oral  PainSc: 0-No pain                 Mahnoor Mathisen A.

## 2019-11-15 NOTE — Interval H&P Note (Signed)
History and Physical Interval Note:  11/15/2019 9:05 AM  Eddie Thompson  has presented today for surgery, with the diagnosis of AFIB.  The various methods of treatment have been discussed with the patient and family. After consideration of risks, benefits and other options for treatment, the patient has consented to  Procedure(s): CARDIOVERSION (N/A) as a surgical intervention.  The patient's history has been reviewed, patient examined, no change in status, stable for surgery.  I have reviewed the patient's chart and labs.  Questions were answered to the patient's satisfaction.     Laddie Math Harrell Gave

## 2019-11-15 NOTE — Transfer of Care (Signed)
Immediate Anesthesia Transfer of Care Note  Patient: Eddie Thompson  Procedure(s) Performed: CARDIOVERSION (N/A )  Patient Location: Endoscopy Unit  Anesthesia Type:General  Level of Consciousness: awake  Airway & Oxygen Therapy: Patient Spontanous Breathing  Post-op Assessment: Report given to RN  Post vital signs: Reviewed and stable  Last Vitals:  Vitals Value Taken Time  BP    Temp    Pulse    Resp    SpO2      Last Pain:  Vitals:   11/15/19 0911  TempSrc: Oral  PainSc: 0-No pain         Complications: No apparent anesthesia complications

## 2019-11-15 NOTE — CV Procedure (Signed)
Procedure:   DCCV  Indication:  Symptomatic atrial fibrillation  Procedure Note:  The patient signed informed consent.  They have had had therapeutic anticoagulation with apixaban greater than 3 weeks.  Anesthesia was administered by Dr. Royce Macadamia. Patient received 50 mg IV lidocaine and 70 mg IV propofol.  Adequate airway was maintained throughout and vital followed per protocol.  They were cardioverted x 1 with 120J of biphasic synchronized energy.  They converted to NSR.  There were no apparent complications.  The patient had normal neuro status and respiratory status post procedure with vitals stable as recorded elsewhere.    Follow up:  They will continue on current medical therapy and follow up with cardiology as scheduled.  Buford Dresser, MD PhD 11/15/2019 9:38 AM

## 2019-11-15 NOTE — Anesthesia Preprocedure Evaluation (Addendum)
Anesthesia Evaluation  Patient identified by MRN, date of birth, ID band Patient awake    Reviewed: Allergy & Precautions, NPO status , Patient's Chart, lab work & pertinent test results, reviewed documented beta blocker date and time   Airway Mallampati: II  TM Distance: >3 FB Neck ROM: Full    Dental no notable dental hx. (+) Teeth Intact, Caps   Pulmonary neg pulmonary ROS,    Pulmonary exam normal breath sounds clear to auscultation       Cardiovascular hypertension, Pt. on medications and Pt. on home beta blockers Normal cardiovascular exam+ dysrhythmias Atrial Fibrillation  Rhythm:Regular Rate:Normal     Neuro/Psych negative neurological ROS  negative psych ROS   GI/Hepatic negative GI ROS, Neg liver ROS,   Endo/Other  Hypothyroidism Mixed hyperlipidemia Gout Obesity  Renal/GU negative Renal ROS  negative genitourinary   Musculoskeletal negative musculoskeletal ROS (+)   Abdominal (+) + obese,   Peds  Hematology Eliquis therapy- last dose this am   Anesthesia Other Findings   Reproductive/Obstetrics                            Anesthesia Physical Anesthesia Plan  ASA: III  Anesthesia Plan:    Post-op Pain Management:    Induction: Intravenous  PONV Risk Score and Plan: 2 and Ondansetron and Treatment may vary due to age or medical condition  Airway Management Planned: Mask  Additional Equipment:   Intra-op Plan:   Post-operative Plan:   Informed Consent: I have reviewed the patients History and Physical, chart, labs and discussed the procedure including the risks, benefits and alternatives for the proposed anesthesia with the patient or authorized representative who has indicated his/her understanding and acceptance.     Dental advisory given  Plan Discussed with: CRNA and Surgeon  Anesthesia Plan Comments:         Anesthesia Quick Evaluation

## 2019-11-15 NOTE — Discharge Instructions (Signed)
Electrical Cardioversion Electrical cardioversion is the delivery of a jolt of electricity to restore a normal rhythm to the heart. A rhythm that is too fast or is not regular keeps the heart from pumping well. In this procedure, sticky patches or metal paddles are placed on the chest to deliver electricity to the heart from a device. This procedure may be done in an emergency if:  There is low or no blood pressure as a result of the heart rhythm.  Normal rhythm must be restored as fast as possible to protect the brain and heart from further damage.  It may save a life. This may also be a scheduled procedure for irregular or fast heart rhythms that are not immediately life-threatening. Tell a health care provider about:  Any allergies you have.  All medicines you are taking, including vitamins, herbs, eye drops, creams, and over-the-counter medicines.  Any problems you or family members have had with anesthetic medicines.  Any blood disorders you have.  Any surgeries you have had.  Any medical conditions you have.  Whether you are pregnant or may be pregnant. What are the risks? Generally, this is a safe procedure. However, problems may occur, including:  Allergic reactions to medicines.  A blood clot that breaks free and travels to other parts of your body.  The possible return of an abnormal heart rhythm within hours or days after the procedure.  Your heart stopping (cardiac arrest). This is rare. What happens before the procedure? Medicines  Your health care provider may have you start taking: ? Blood-thinning medicines (anticoagulants) so your blood does not clot as easily. ? Medicines to help stabilize your heart rate and rhythm.  Ask your health care provider about: ? Changing or stopping your regular medicines. This is especially important if you are taking diabetes medicines or blood thinners. ? Taking medicines such as aspirin and ibuprofen. These medicines can  thin your blood. Do not take these medicines unless your health care provider tells you to take them. ? Taking over-the-counter medicines, vitamins, herbs, and supplements. General instructions  Follow instructions from your health care provider about eating or drinking restrictions.  Plan to have someone take you home from the hospital or clinic.  If you will be going home right after the procedure, plan to have someone with you for 24 hours.  Ask your health care provider what steps will be taken to help prevent infection. These may include washing your skin with a germ-killing soap. What happens during the procedure?   An IV will be inserted into one of your veins.  Sticky patches (electrodes) or metal paddles may be placed on your chest.  You will be given a medicine to help you relax (sedative).  An electrical shock will be delivered. The procedure may vary among health care providers and hospitals. What can I expect after the procedure?  Your blood pressure, heart rate, breathing rate, and blood oxygen level will be monitored until you leave the hospital or clinic.  Your heart rhythm will be watched to make sure it does not change.  You may have some redness on the skin where the shocks were given. Follow these instructions at home:  Do not drive for 24 hours if you were given a sedative during your procedure.  Take over-the-counter and prescription medicines only as told by your health care provider.  Ask your health care provider how to check your pulse. Check it often.  Rest for 48 hours after the procedure or   as told by your health care provider.  Avoid or limit your caffeine use as told by your health care provider.  Keep all follow-up visits as told by your health care provider. This is important. Contact a health care provider if:  You feel like your heart is beating too quickly or your pulse is not regular.  You have a serious muscle cramp that does not go  away. Get help right away if:  You have discomfort in your chest.  You are dizzy or you feel faint.  You have trouble breathing or you are short of breath.  Your speech is slurred.  You have trouble moving an arm or leg on one side of your body.  Your fingers or toes turn cold or blue. Summary  Electrical cardioversion is the delivery of a jolt of electricity to restore a normal rhythm to the heart.  This procedure may be done right away in an emergency or may be a scheduled procedure if the condition is not an emergency.  Generally, this is a safe procedure.  After the procedure, check your pulse often as told by your health care provider. This information is not intended to replace advice given to you by your health care provider. Make sure you discuss any questions you have with your health care provider. Document Revised: 01/14/2019 Document Reviewed: 01/14/2019 Elsevier Patient Education  2020 Elsevier Inc.  

## 2019-11-19 ENCOUNTER — Other Ambulatory Visit (HOSPITAL_COMMUNITY): Payer: Medicare Other

## 2019-11-28 NOTE — Progress Notes (Signed)
Cardiology Office Note    Date:  11/29/2019   ID:  Eddie Thompson, DOB 1951/04/23, MRN [id number]  PCP:  [name], MD  Cardiologist:  Dr. Harrington Challenger  Chief Complaint: Cardioversion follow up  History of Present Illness:   Eddie Thompson is a 69 y.o. male with hx of PAF, HTN and HLD seen for follow up.   Dx with afib 07/2019. Last seen by Dr. Harrington Challenger 11/07/19. Some dyspnea with activity.  Echo with preserved LVEF. Underwent successfuly 11/15/19 cardioverted x 1 with 120J of biphasic synchronized energy.  Here today for follow up. Maintaining sinus rhythm. Dyspnea on exertion has resolved. He thinks he is back to his baseline. 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 He denies chest pain, palpitation, orthopnea, PND, syncope, lower extremity edema or melena.   Past Medical History:  Diagnosis Date  . Actinic keratosis   . Atrial flutter ([place])   . Gout   . HTN (hypertension)   . Hypothyroidism   . Mixed hyperlipidemia     Past Surgical History:  Procedure Laterality Date  . CARDIOVERSION N/A 11/15/2019   Procedure: CARDIOVERSION;  Surgeon: Buford Dresser, MD;  Location: [facility] ENDOSCOPY;  Service: Cardiovascular;  Laterality: N/A;    Current Medications: Prior to Admission medications   Medication Sig Start Date End Date Taking? Authorizing Provider  allopurinol (ZYLOPRIM) 100 MG tablet Take 100 mg by mouth daily. 03/29/19   [provider]  alum hydroxide-mag trisilicate (GAVISCON) [id number] MG CHEW chewable tablet Chew 2 tablets by mouth daily as needed for indigestion or heartburn.    [provider]  apixaban (ELIQUIS) 5 MG TABS tablet Take 1 tablet (5 mg total) by mouth 2 (two) times daily. 10/15/19   Fay Records, MD  atorvastatin (LIPITOR) 40 MG tablet Take 40 mg by mouth daily.    [provider]  famotidine (PEPCID) 10 MG tablet Take 10 mg by mouth daily.    [provider]  levothyroxine (SYNTHROID)  100 MCG tablet Take 100 mcg by mouth daily before breakfast.    [provider]  losartan-hydrochlorothiazide (HYZAAR) 100-12.5 MG tablet Take 1 tablet by mouth daily.    [provider]  Metoprolol Succinate 100 MG CS24 Take 100 mg by mouth daily.     [provider]  Multiple Vitamin (MULTIVITAMIN) capsule Take 1 capsule by mouth daily.    [provider]  Omega-3 Fatty Acids (FISH OIL) 1000 MG CAPS Take 1,000-2,000 mg by mouth See admin instructions. Alternate taking 1000 mg one day then 2000 mg the next    [provider]  pyridOXINE (VITAMIN B-6) 100 MG tablet Take 100 mg by mouth daily.    [provider]    Allergies:   Dolobid [diflunisal], Other, and Tetracyclines & related   Social History   Socioeconomic History  . Marital status: Married    Spouse name: Not on file  . Number of children: Not on file  . Years of education: Not on file  . Highest education level: Not on file  Occupational History  . Not on file  Tobacco Use  . Smoking status: Never Smoker  . Smokeless tobacco: Never Used  Substance and Sexual Activity  . Alcohol use: Never  . Drug use: Never  . Sexual activity: Not on file  Other Topics Concern  . Not on file  Social History Narrative  . Not on file   Social Determinants of Health  Financial Resource Strain:   . Difficulty of Paying Living Expenses:   Food Insecurity:   . Worried About Charity fundraiser in the Last Year:   . Arboriculturist in the Last Year:   Transportation Needs:   . Film/video editor (Medical):   Marland Kitchen Lack of Transportation (Non-Medical):   Physical Activity:   . Days of Exercise per Week:   . Minutes of Exercise per Session:   Stress:   . Feeling of Stress :   Social Connections:   . Frequency of Communication with Friends and Family:   . Frequency of Social Gatherings with Friends and Family:   . Attends Religious Services:   . Active Member of Clubs or  Organizations:   . Attends Archivist Meetings:   Marland Kitchen Marital Status:      Family History:  The patient's family history is not on file.   ROS:   Please see the history of present illness.    ROS All other systems reviewed and are negative.   PHYSICAL EXAM:   VS:  BP (!) 150/94   Pulse (!) 51   Ht 5\' 6"  (1.676 m)   Wt 192 lb 12.8 oz (87.5 kg)   BMI 31.12 kg/m    GEN: Well nourished, well developed, in no acute distress  HEENT: normal  Neck: no JVD, carotid bruits, or masses Cardiac: RRR; no murmurs, rubs, or gallops,no edema  Respiratory:  clear to auscultation bilaterally, normal work of breathing GI: soft, nontender, nondistended, + BS MS: no deformity or atrophy  Skin: warm and dry, no rash Neuro:  Alert and Oriented x 3, Strength and sensation are intact Psych: euthymic mood, full affect  Wt Readings from Last 3 Encounters:  11/29/19 192 lb 12.8 oz (87.5 kg)  11/15/19 192 lb 10.9 oz (87.4 kg)  11/07/19 192 lb 9.6 oz (87.4 kg)      Studies/Labs Reviewed:   EKG:  EKG is ordered today.  The ekg ordered today demonstrates sinus bradycardia at rate of 51 bpm  Recent Labs: 11/07/2019: BUN 13; Creatinine, Ser 1.29; Hemoglobin 17.3; Platelets 242; Potassium 4.1; Sodium 143   Lipid Panel No results found for: CHOL, TRIG, HDL, CHOLHDL, VLDL, LDLCALC, LDLDIRECT  Additional studies/ records that were reviewed today include:   Echocardiogram: 10/03/19 1. Left ventricular ejection fraction, by estimation, is 60 to 65%. The  left ventricle has normal function. The left ventricle has no regional  wall motion abnormalities. There is mild left ventricular hypertrophy.  Left ventricular diastolic parameters  are indeterminate.  2. Right ventricular systolic function is mildly reduced. The right  ventricular size is mildly enlarged. Tricuspid regurgitation signal is  inadequate for assessing PA pressure.  3. Left atrial size was mild to moderately dilated.  4. Right  atrial size was mildly dilated.  5. The mitral valve is normal in structure. Mild to moderate mitral valve  regurgitation. No evidence of mitral stenosis.  6. The aortic valve is tricuspid. Aortic valve regurgitation is not  visualized. No aortic stenosis is present.  7. Aortic dilatation noted. There is mild dilatation of the ascending  aorta measuring 39 mm.  8. The inferior vena cava is normal in size with greater than 50%  respiratory variability, suggesting right atrial pressure of 3 mmHg.  9. The patient was in atrial fibrillation.   CT angio of chest 10/28/19 IMPRESSION: 1. No demonstrable pulmonary embolus. No thoracic aortic aneurysm or dissection. There is aortic atherosclerosis as well  as foci of coronary artery calcification.  2.  Mild right base atelectasis.  No edema or airspace opacity.  3.  No adenopathy.    ASSESSMENT & PLAN:    1. Paroxysmal atrial fibrillation Maintaining sinus rhythm. No bleeding issue. Continue Eliquis and beta-blocker.  2. Hypertension -Initial blood pressure of 150/94. Repeat check by myself with improvement to 140/80. Continue current medical therapy. He works at the Research officer, trade union at [employer]. Advised to monitor blood pressure.  3. Hyperlipidemia -Continue statin.  Medication Adjustments/Labs and Tests Ordered: Current medicines are reviewed at length with the patient today.  Concerns regarding medicines are outlined above.  Medication changes, Labs and Tests ordered today are listed in the Patient Instructions below. Patient Instructions  Medication Instructions:  Your physician recommends that you continue on your current medications as directed. Please refer to the Current Medication list given to you today.  *If you need a refill on your cardiac medications before your next appointment, please call your pharmacy*   Lab Work: None ordered  If you have labs (blood work) drawn today and your tests are completely normal,  you will receive your results only by: Marland Kitchen MyChart Message (if you have MyChart) OR . A paper copy in the mail If you have any lab test that is abnormal or we need to change your treatment, we will call you to review the results.   Testing/Procedures: None ordered   Follow-Up: At [facility], you and your health needs are our priority.  As part of our continuing mission to provide you with exceptional heart care, we have created designated Provider Care Teams.  These Care Teams include your primary Cardiologist (physician) and Advanced Practice Providers (APPs -  Physician Assistants and Nurse Practitioners) who all work together to provide you with the care you need, when you need it.  We recommend signing up for the patient portal called "MyChart".  Sign up information is provided on this After Visit Summary.  MyChart is used to connect with patients for Virtual Visits (Telemedicine).  Patients are able to view lab/test results, encounter notes, upcoming appointments, etc.  Non-urgent messages can be sent to your provider as well.   To learn more about what you can do with MyChart, go to [email or web address].    Your next appointment:   3-4  month(s)  The format for your next appointment:   In Person  Provider:   You may see Dr. Dorris Carnes, or one of the following Advanced Practice Providers on your designated Care Team:    Richardson Dopp, PA-C  Robbie Lis, PA-C    Other Instructions      Signed, Leanor Kail, [place]  11/29/2019 9:54 AM    [address], [place], [place]  [address] Phone: [phone]; Fax: [phone]

## 2019-11-29 ENCOUNTER — Encounter: Payer: Self-pay | Admitting: Physician Assistant

## 2019-11-29 ENCOUNTER — Ambulatory Visit (INDEPENDENT_AMBULATORY_CARE_PROVIDER_SITE_OTHER): Payer: Medicare Other | Admitting: Physician Assistant

## 2019-11-29 ENCOUNTER — Other Ambulatory Visit: Payer: Self-pay

## 2019-11-29 VITALS — BP 150/94 | HR 51 | Ht 66.0 in | Wt 192.8 lb

## 2019-11-29 DIAGNOSIS — I4891 Unspecified atrial fibrillation: Secondary | ICD-10-CM | POA: Diagnosis not present

## 2019-11-29 DIAGNOSIS — E782 Mixed hyperlipidemia: Secondary | ICD-10-CM

## 2019-11-29 DIAGNOSIS — I10 Essential (primary) hypertension: Secondary | ICD-10-CM | POA: Diagnosis not present

## 2019-11-29 DIAGNOSIS — I4811 Longstanding persistent atrial fibrillation: Secondary | ICD-10-CM

## 2019-11-29 DIAGNOSIS — I480 Paroxysmal atrial fibrillation: Secondary | ICD-10-CM | POA: Diagnosis not present

## 2019-11-29 NOTE — Patient Instructions (Addendum)
Medication Instructions:  Your physician recommends that you continue on your current medications as directed. Please refer to the Current Medication list given to you today.  *If you need a refill on your cardiac medications before your next appointment, please call your pharmacy*   Lab Work: None ordered  If you have labs (blood work) drawn today and your tests are completely normal, you will receive your results only by:  [place] (if you have MyChart) OR  A paper copy in the mail If you have any lab test that is abnormal or we need to change your treatment, we will call you to review the results.   Testing/Procedures: None ordered   Follow-Up: At [facility], you and your health needs are our priority.  As part of our continuing mission to provide you with exceptional heart care, we have created designated Provider Care Teams.  These Care Teams include your primary Cardiologist (physician) and Advanced Practice Providers (APPs -  Physician Assistants and Nurse Practitioners) who all work together to provide you with the care you need, when you need it.  We recommend signing up for the patient portal called "MyChart".  Sign up information is provided on this After Visit Summary.  MyChart is used to connect with patients for Virtual Visits (Telemedicine).  Patients are able to view lab/test results, encounter notes, upcoming appointments, etc.  Non-urgent messages can be sent to your provider as well.   To learn more about what you can do with MyChart, go to [email or web address].    Your next appointment:   3-4  month(s)  The format for your next appointment:   In Person  Provider:   You may see Dr. Dorris Carnes, or one of the following Advanced Practice Providers on your designated Care Team:    Richardson Dopp, PA-C  Robbie Lis, [place]    Other Instructions

## 2020-03-10 DIAGNOSIS — Z0000 Encounter for general adult medical examination without abnormal findings: Secondary | ICD-10-CM | POA: Diagnosis not present

## 2020-03-10 DIAGNOSIS — E039 Hypothyroidism, unspecified: Secondary | ICD-10-CM | POA: Diagnosis not present

## 2020-03-10 DIAGNOSIS — D6869 Other thrombophilia: Secondary | ICD-10-CM | POA: Diagnosis not present

## 2020-03-10 DIAGNOSIS — Z23 Encounter for immunization: Secondary | ICD-10-CM | POA: Diagnosis not present

## 2020-03-10 DIAGNOSIS — E782 Mixed hyperlipidemia: Secondary | ICD-10-CM | POA: Diagnosis not present

## 2020-03-10 DIAGNOSIS — I10 Essential (primary) hypertension: Secondary | ICD-10-CM | POA: Diagnosis not present

## 2020-03-10 DIAGNOSIS — M109 Gout, unspecified: Secondary | ICD-10-CM | POA: Diagnosis not present

## 2020-03-10 DIAGNOSIS — Z125 Encounter for screening for malignant neoplasm of prostate: Secondary | ICD-10-CM | POA: Diagnosis not present

## 2020-03-10 DIAGNOSIS — R202 Paresthesia of skin: Secondary | ICD-10-CM | POA: Diagnosis not present

## 2020-03-26 NOTE — Progress Notes (Signed)
Cardiology Office Note   Date:  03/27/2020   ID:  Eddie Thompson, DOB Dec 03, 1950, MRN [id number]  PCP:  [name], MD  Cardiologist:   Dorris Carnes, MD   Pt returns for eval of afib and DOE      History of Present Illness: Eddie Thompson is a 69 y.o. male who is followed by C Melinda Crutch Seen in Feb 2021   Found tpbe in atrial fibrillation I saw him in March   He said he was active but noticed over several months increased SOB with acitvity.  He denied CP      Echo done  LVEF and RVEF were normal   Atria were  enlarged    CT of chest done   No signif lung findings   No  PE   Scattered calcifications   I last saw the pt in May 2021 and he went on to have  cardioversion after   He was then seen by B Bhagat in June  The pt says his breathing is good   Again, it was short when he was in afib    He denies CP   No dizziness  No palpitations   Current Meds  Medication Sig   allopurinol (ZYLOPRIM) 100 MG tablet Take 100 mg by mouth daily.   alum hydroxide-mag trisilicate (GAVISCON) 54-65 MG CHEW chewable tablet Chew 2 tablets by mouth daily as needed for indigestion or heartburn.   apixaban (ELIQUIS) 5 MG TABS tablet Take 1 tablet (5 mg total) by mouth 2 (two) times daily.   atorvastatin (LIPITOR) 40 MG tablet Take 40 mg by mouth daily.   famotidine (PEPCID) 10 MG tablet Take 10 mg by mouth daily.   levothyroxine (SYNTHROID) 100 MCG tablet Take 100 mcg by mouth daily before breakfast.   losartan-hydrochlorothiazide (HYZAAR) 100-12.5 MG tablet Take 1 tablet by mouth daily.   Metoprolol Succinate 100 MG CS24 Take 100 mg by mouth daily.    Multiple Vitamin (MULTIVITAMIN) capsule Take 1 capsule by mouth daily.   Omega-3 Fatty Acids (FISH OIL) 1000 MG CAPS Take 1,000-2,000 mg by mouth See admin instructions. Alternate taking 1000 mg one day then 2000 mg the next   pyridOXINE (VITAMIN B-6) 100 MG tablet Take 100 mg by mouth daily.     Allergies:   Dolobid  [diflunisal], Other, and Tetracyclines & related   Past Medical History:  Diagnosis Date   Actinic keratosis    Atrial flutter ([place])    Gout    HTN (hypertension)    Hypothyroidism    Mixed hyperlipidemia       Social History:  The patient  reports that he has never smoked. He has never used smokeless tobacco. He reports that he does not drink alcohol and does not use drugs.   Family History:  The patient's family history is not on file.    ROS:  Please see the history of present illness. All other systems are reviewed and  Negative to the above problem except as noted.    PHYSICAL EXAM: VS:  BP (!) 142/82    Pulse (!) 50    Ht 5\' 6"  (1.676 m)    Wt 190 lb 12.8 oz (86.5 kg)    SpO2 97%    BMI 30.80 kg/m   GEN: Obese 68yo, in no acute distress  HEENT: normal  Neck: no JVD Cardiac: Irreg irreg   No murmurs  No LE edema    Respiratory:  clear to  auscultation bilaterally, normal work of breathing GI: soft, nontender, nondistended, + BS  No hepatomegaly  MS: no deformity Moving all extremities   Skin: warm and dry, no rash   EKG:  EKG is ordered today  SB 50 bpm   First degree AV block  Pr 206 msec  Lipid Panel No results found for: CHOL, TRIG, HDL, CHOLHDL, VLDL, LDLCALC, LDLDIRECT    Wt Readings from Last 3 Encounters:  03/27/20 190 lb 12.8 oz (86.5 kg)  11/29/19 192 lb 12.8 oz (87.5 kg)  11/15/19 192 lb 10.9 oz (87.4 kg)      ASSESSMENT AND PLAN:  1Atrial fibrillation   Pt underwent cardioversion in the spring  He is maintaing SR   HR iis 50  He denies SOB   Would keep him on current regimen  2  HTN  BP up a little  He does not check at home  She says he will work on wt, says when his  wt goes down his BP  improves  3  HL  Lipids excellent  LDL 45  HDL 47  4  Wt  Dicussed diet and weight loss    Plan for F/U in 6 months    Current medicines are reviewed at length with the patient today.  The patient does not have concerns regarding  medicines.  Signed, Dorris Carnes, MD  03/27/2020 9:41 AM    [address], [place], [place]  [address] Phone: [phone]; Fax: [phone]

## 2020-03-27 ENCOUNTER — Ambulatory Visit (INDEPENDENT_AMBULATORY_CARE_PROVIDER_SITE_OTHER): Payer: Medicare Other | Admitting: Internal Medicine

## 2020-03-27 ENCOUNTER — Encounter: Payer: Self-pay | Admitting: Internal Medicine

## 2020-03-27 ENCOUNTER — Other Ambulatory Visit: Payer: Self-pay

## 2020-03-27 VITALS — BP 142/82 | HR 50 | Ht 66.0 in | Wt 190.8 lb

## 2020-03-27 DIAGNOSIS — I4891 Unspecified atrial fibrillation: Secondary | ICD-10-CM | POA: Diagnosis not present

## 2020-03-27 DIAGNOSIS — I10 Essential (primary) hypertension: Secondary | ICD-10-CM

## 2020-03-27 NOTE — Patient Instructions (Signed)
Medication Instructions:  No changes *If you need a refill on your cardiac medications before your next appointment, please call your pharmacy*   Lab Work: none If you have labs (blood work) drawn today and your tests are completely normal, you will receive your results only by: Marland Kitchen MyChart Message (if you have MyChart) OR . A paper copy in the mail If you have any lab test that is abnormal or we need to change your treatment, we will call you to review the results.   Testing/Procedures: none   Follow-Up: At [facility], you and your health needs are our priority.  As part of our continuing mission to provide you with exceptional heart care, we have created designated Provider Care Teams.  These Care Teams include your primary Cardiologist (physician) and Advanced Practice Providers (APPs -  Physician Assistants and Nurse Practitioners) who all work together to provide you with the care you need, when you need it.   Your next appointment:   6 month(s)  (April 2022)  The format for your next appointment:   In Person  Provider:   You may see Dorris Carnes, MD or one of the following Advanced Practice Providers on your designated Care Team:    Richardson Dopp, PA-C  Robbie Lis, [place]    Other Instructions

## 2020-03-31 ENCOUNTER — Other Ambulatory Visit: Payer: Self-pay | Admitting: Internal Medicine

## 2020-03-31 NOTE — Telephone Encounter (Signed)
Eliquis 5mg  refill request received. Patient is 69 years old, weight-86.5kg, Crea-1.29 on 11/07/2019, Diagnosis-Afib, and last seen by Dr. Harrington Challenger on 03/27/2020. Dose is appropriate based on dosing criteria. Will send in refill to requested pharmacy.

## 2020-06-10 DIAGNOSIS — I10 Essential (primary) hypertension: Secondary | ICD-10-CM | POA: Diagnosis not present

## 2020-06-10 DIAGNOSIS — E782 Mixed hyperlipidemia: Secondary | ICD-10-CM | POA: Diagnosis not present

## 2020-06-10 DIAGNOSIS — E039 Hypothyroidism, unspecified: Secondary | ICD-10-CM | POA: Diagnosis not present

## 2020-07-10 NOTE — Telephone Encounter (Signed)
Patient thinks he is back in afib   Wnded    His appt with me is in March   Would be good to be seen sooner.   Either with me or app in the next 1-2 wks    Or refer to afib clinic if cant be seen sooner though not clear he is in afib agaln

## 2020-07-13 NOTE — Telephone Encounter (Signed)
Called patient.  Scheduled him with Dr. Harrington Challenger 07/16/20.  Pt is not having any significant SOB or any chest pain or noticing any rapid HRs.  Gets winded w exertion.  Pt aware to call if symptoms change.

## 2020-07-15 NOTE — H&P (View-Only) (Signed)
Cardiology Office Note   Date:  07/16/2020   ID:  REDFORD BEHRLE, DOB 26-Feb-1951, MRN [id number]  PCP:  [name], MD  Cardiologist:   Dorris Carnes, MD   Pt returns for eval of afib and DOE      History of Present Illness: Eddie Thompson is a 70 y.o. male who is followed by C Melinda Crutch  Found to be in afib   I saw him in March   He said he was active but noticed over several months increased SOB with acitvity.  He denied CP      Echo done  LVEF and RVEF were normal   Atria were  enlarged    CT of chest done   No signif lung findings   No  PE   Scattered calcifications   I last saw the pt in May 2021 and he went on to have  cardioversion after   He was then seen by B Bhagat in June  The pt was last in clinic in  Oct 2021  He was in SR at the time   The pt called in last week  Michela Pitcher he thought he was back in afib   Felt more winded    Felt a little catch in L back    Denies CP   No dizziness   Says it is not too bad.    Current Meds  Medication Sig  . allopurinol (ZYLOPRIM) 100 MG tablet Take 100 mg by mouth daily.  Marland Kitchen alum hydroxide-mag trisilicate (GAVISCON) 60-63 MG CHEW chewable tablet Chew 2 tablets by mouth daily as needed for indigestion or heartburn.  Marland Kitchen atorvastatin (LIPITOR) 40 MG tablet Take 40 mg by mouth daily.  Marland Kitchen ELIQUIS 5 MG TABS tablet TAKE 1 TABLET BY MOUTH TWICE A DAY  . levothyroxine (SYNTHROID) 100 MCG tablet Take 100 mcg by mouth daily before breakfast.  . losartan-hydrochlorothiazide (HYZAAR) 100-12.5 MG tablet Take 1 tablet by mouth daily.  . Metoprolol Succinate 100 MG CS24 Take 100 mg by mouth daily.   . Multiple Vitamin (MULTIVITAMIN) capsule Take 1 capsule by mouth daily.  . Omega-3 Fatty Acids (FISH OIL) 1000 MG CAPS Take 1,000-2,000 mg by mouth See admin instructions. Alternate taking 1000 mg one day then 2000 mg the next  . pyridOXINE (VITAMIN B-6) 100 MG tablet Take 100 mg by mouth daily.     Allergies:   Dolobid [diflunisal], Other,  and Tetracyclines & related   Past Medical History:  Diagnosis Date  . Actinic keratosis   . Atrial flutter ([place])   . Gout   . HTN (hypertension)   . Hypothyroidism   . Mixed hyperlipidemia       Social History:  The patient  reports that he has never smoked. He has never used smokeless tobacco. He reports that he does not drink alcohol and does not use drugs.   Family History:  The patient's family history is not on file.    ROS:  Please see the history of present illness. All other systems are reviewed and  Negative to the above problem except as noted.    PHYSICAL EXAM: VS:  BP 122/76   Pulse 88   Ht 5\' 6"  (1.676 m)   Wt 194 lb 12.8 oz (88.4 kg)   SpO2 98%   BMI 31.44 kg/m   GEN: Obese 70yo, in no acute distress  HEENT: normal  Neck: no JVD Cardiac: Irreg irreg   No murmurs  No  LE edema    Respiratory:  clear to auscultation bilaterally, normal work of breathing GI: soft, nontender, nondistended, + BS  No hepatomegaly  MS: no deformity Moving all extremities   Skin: warm and dry, no rash   EKG:  EKG is done  Atrial fibrillation   81 bpm    Lipid Panel No results found for: CHOL, TRIG, HDL, CHOLHDL, VLDL, LDLCALC, LDLDIRECT    Wt Readings from Last 3 Encounters:  07/16/20 194 lb 12.8 oz (88.4 kg)  03/27/20 190 lb 12.8 oz (86.5 kg)  11/29/19 192 lb 12.8 oz (87.5 kg)      ASSESSMENT AND PLAN:  1Atrial fibrillation The pt is back in atrial fibrillation  Rates OK   Notes some SOB for a couple weeks   He underwent cardioversion last spring   Felt better after    No SOB      I would recomm repeat cardioversion   WIll need to follow how feels after   He says he has not missed an Eliquis dose  WIll also set up pt to be seen by Elliot Cousin for further Rx after     2  HTN  BP is good today   3  HL  Lipids excellent  LDL 45  HDL 47    Current medicines are reviewed at length with the patient today.  The patient does not have concerns regarding  medicines.  Signed, Dorris Carnes, MD  07/16/2020 8:49 AM    [address], [place], [place]  [address] Phone: [phone]; Fax: [phone]

## 2020-07-15 NOTE — Progress Notes (Unsigned)
 Cardiology Office Note   Date:  07/16/2020   ID:  Eddie Thompson, DOB 06/07/1951, MRN [id number]  PCP:  Breda Bond, Charles Alan, MD  Cardiologist:   Faizah Kandler, MD   Pt returns for eval of afib and DOE      History of Present Illness: Eddie Thompson is a 70 y.o. male who is followed by C Eddie Sanay [place]mar  Found to be in afib   I saw him in March   He said he was active but noticed over several months increased SOB with acitvity.  He denied CP      Echo done  LVEF and RVEF were normal   Atria were  enlarged    CT of chest done   No signif lung findings   No  PE   Scattered calcifications   I last saw the pt in May 2021 and he went on to have  cardioversion after   He was then seen by B Bhagat in June  The pt was last in clinic in  Oct 2021  He was in SR at the time   The pt called in last week  Said he thought he was back in afib   Felt more winded    Felt a little catch in L back    Denies CP   No dizziness   Says it is not too bad.    Current Meds  Medication Sig  . allopurinol (ZYLOPRIM) 100 MG tablet Take 100 mg by mouth daily.  . alum hydroxide-mag trisilicate (GAVISCON) 80-20 MG CHEW chewable tablet Chew 2 tablets by mouth daily as needed for indigestion or heartburn.  . atorvastatin (LIPITOR) 40 MG tablet Take 40 mg by mouth daily.  . ELIQUIS 5 MG TABS tablet TAKE 1 TABLET BY MOUTH TWICE A DAY  . levothyroxine (SYNTHROID) 100 MCG tablet Take 100 mcg by mouth daily before breakfast.  . losartan-hydrochlorothiazide (HYZAAR) 100-12.5 MG tablet Take 1 tablet by mouth daily.  . Metoprolol Succinate 100 MG CS24 Take 100 mg by mouth daily.   . Multiple Vitamin (MULTIVITAMIN) capsule Take 1 capsule by mouth daily.  . Omega-3 Fatty Acids (FISH OIL) 1000 MG CAPS Take 1,000-2,000 mg by mouth See admin instructions. Alternate taking 1000 mg one day then 2000 mg the next  . pyridOXINE (VITAMIN B-6) 100 MG tablet Take 100 mg by mouth daily.     Allergies:   Dolobid [diflunisal], Other,  and Tetracyclines & related   Past Medical History:  Diagnosis Date  . Actinic keratosis   . Atrial flutter ([facility])   . Gout   . HTN (hypertension)   . Hypothyroidism   . Mixed hyperlipidemia       Social History:  The patient  reports that he has never smoked. He has never used smokeless tobacco. He reports that he does not drink alcohol and does not use drugs.   Family History:  The patient's family history is not on file.    ROS:  Please see the history of present illness. All other systems are reviewed and  Negative to the above problem except as noted.    PHYSICAL EXAM: VS:  BP 122/76   Pulse 88   Ht 5' 6" (1.676 m)   Wt 194 lb 12.8 oz (88.4 kg)   SpO2 98%   BMI 31.44 kg/m   GEN: Obese 70yo, in no acute distress  HEENT: normal  Neck: no JVD Cardiac: Irreg irreg   No murmurs  No   LE edema    Respiratory:  clear to auscultation bilaterally, normal work of breathing GI: soft, nontender, nondistended, + BS  No hepatomegaly  MS: no deformity Moving all extremities   Skin: warm and dry, no rash   EKG:  EKG is done  Atrial fibrillation   81 bpm    Lipid Panel No results found for: CHOL, TRIG, HDL, CHOLHDL, VLDL, LDLCALC, LDLDIRECT    Wt Readings from Last 3 Encounters:  07/16/20 194 lb 12.8 oz (88.4 kg)  03/27/20 190 lb 12.8 oz (86.5 kg)  11/29/19 192 lb 12.8 oz (87.5 kg)      ASSESSMENT AND PLAN:  1Atrial fibrillation The pt is back in atrial fibrillation  Rates OK   Notes some SOB for a couple weeks   He underwent cardioversion last spring   Felt better after    No SOB      I would recomm repeat cardioversion   WIll need to follow how feels after   He says he has not missed an Eliquis dose  WIll also set up pt to be seen by Elliot Cousin for further Rx after     2  HTN  BP is good today   3  HL  Lipids excellent  LDL 45  HDL 47    Current medicines are reviewed at length with the patient today.  The patient does not have concerns regarding  medicines.  Signed, Dorris Carnes, MD  07/16/2020 8:49 AM    [address], [place], [place]  [address] Phone: [phone]; Fax: [phone]

## 2020-07-16 ENCOUNTER — Encounter: Payer: Self-pay | Admitting: *Deleted

## 2020-07-16 ENCOUNTER — Telehealth: Payer: Self-pay | Admitting: Internal Medicine

## 2020-07-16 ENCOUNTER — Ambulatory Visit (INDEPENDENT_AMBULATORY_CARE_PROVIDER_SITE_OTHER): Payer: Medicare Other | Admitting: Internal Medicine

## 2020-07-16 ENCOUNTER — Encounter: Payer: Self-pay | Admitting: Internal Medicine

## 2020-07-16 ENCOUNTER — Other Ambulatory Visit: Payer: Self-pay

## 2020-07-16 VITALS — BP 122/76 | HR 88 | Ht 66.0 in | Wt 194.8 lb

## 2020-07-16 DIAGNOSIS — Z01812 Encounter for preprocedural laboratory examination: Secondary | ICD-10-CM | POA: Diagnosis not present

## 2020-07-16 DIAGNOSIS — I4891 Unspecified atrial fibrillation: Secondary | ICD-10-CM | POA: Diagnosis not present

## 2020-07-16 LAB — CBC
Hematocrit: 47.5 % (ref 37.5–51.0)
Hemoglobin: 16.6 g/dL (ref 13.0–17.7)
MCH: 34.4 pg — ABNORMAL HIGH (ref 26.6–33.0)
MCHC: 34.9 g/dL (ref 31.5–35.7)
MCV: 98 fL — ABNORMAL HIGH (ref 79–97)
Platelets: 199 10*3/uL (ref 150–450)
RBC: 4.83 x10E6/uL (ref 4.14–5.80)
RDW: 13.5 % (ref 11.6–15.4)
WBC: 7.9 10*3/uL (ref 3.4–10.8)

## 2020-07-16 LAB — BASIC METABOLIC PANEL
BUN/Creatinine Ratio: 17 (ref 10–24)
BUN: 19 mg/dL (ref 8–27)
CO2: 35 mmol/L — ABNORMAL HIGH (ref 20–29)
Calcium: 10 mg/dL (ref 8.6–10.2)
Chloride: 104 mmol/L (ref 96–106)
Creatinine, Ser: 1.15 mg/dL (ref 0.76–1.27)
GFR calc Af Amer: 75 mL/min/{1.73_m2} (ref >59)
GFR calc non Af Amer: 65 mL/min/{1.73_m2} (ref >59)
Glucose: 115 mg/dL — ABNORMAL HIGH (ref 65–99)
Potassium: 4.3 mmol/L (ref 3.5–5.2)
Sodium: 143 mmol/L (ref 134–144)

## 2020-07-16 NOTE — Telephone Encounter (Signed)
Called patient and confirmed date for DCCV. Sent letter w corrected date to his 78.

## 2020-07-16 NOTE — Telephone Encounter (Signed)
Patient states he would like to verify the date of his cardioversion. He states the paper he was given says 08/24/2020, but he was told 07/24/2020.

## 2020-07-16 NOTE — Patient Instructions (Signed)
Medication Instructions:  No changes today *If you need a refill on your cardiac medications before your next appointment, please call your pharmacy*   Lab Work: Today: BMET, CBC  If you have labs (blood work) drawn today and your tests are completely normal, you will receive your results only by: Marland Kitchen MyChart Message (if you have MyChart) OR . A paper copy in the mail If you have any lab test that is abnormal or we need to change your treatment, we will call you to review the results.   Testing/Procedures: Your physician has recommended that you have a Cardioversion (DCCV). Electrical Cardioversion uses a jolt of electricity to your heart either through paddles or wired patches attached to your chest. This is a controlled, usually prescheduled, procedure. Defibrillation is done under light anesthesia in the hospital, and you usually go home the day of the procedure. This is done to get your heart back into a normal rhythm. You are not awake for the procedure. Please see the instruction sheet given to you today.   Follow-Up: Your next appointment should be with Cardiac Electrophysiology (EP) to discuss additional afib treatments   Other Instructions You have been referred to Electrophysiology --Dr. Curt Bears

## 2020-07-22 ENCOUNTER — Other Ambulatory Visit (HOSPITAL_COMMUNITY)
Admission: RE | Admit: 2020-07-22 | Discharge: 2020-07-22 | Disposition: A | Discharge status: Home / Self Care | Payer: Medicare Other | Source: Ambulatory Visit | Attending: Cardiovascular Disease | Admitting: Cardiovascular Disease

## 2020-07-22 DIAGNOSIS — Z01812 Encounter for preprocedural laboratory examination: Secondary | ICD-10-CM | POA: Insufficient documentation

## 2020-07-22 DIAGNOSIS — Z20822 Contact with and (suspected) exposure to covid-19: Secondary | ICD-10-CM | POA: Insufficient documentation

## 2020-07-22 LAB — SARS CORONAVIRUS 2 (TAT 6-24 HRS): SARS Coronavirus 2: NEGATIVE

## 2020-07-24 ENCOUNTER — Ambulatory Visit (HOSPITAL_COMMUNITY): Payer: Medicare Other | Admitting: Certified Registered Nurse Anesthetist

## 2020-07-24 ENCOUNTER — Ambulatory Visit (HOSPITAL_COMMUNITY)
Admission: RE | Admit: 2020-07-24 | Discharge: 2020-07-24 | Disposition: A | Discharge status: Home / Self Care | Payer: Medicare Other | Attending: Cardiovascular Disease | Admitting: Cardiovascular Disease

## 2020-07-24 ENCOUNTER — Encounter (HOSPITAL_COMMUNITY): Payer: Self-pay | Admitting: Cardiovascular Disease

## 2020-07-24 ENCOUNTER — Other Ambulatory Visit: Payer: Self-pay

## 2020-07-24 ENCOUNTER — Encounter (HOSPITAL_COMMUNITY)
Admission: RE | Disposition: A | Discharge status: Home / Self Care | Payer: Self-pay | Source: Home / Self Care | Attending: Cardiovascular Disease

## 2020-07-24 DIAGNOSIS — I10 Essential (primary) hypertension: Secondary | ICD-10-CM | POA: Insufficient documentation

## 2020-07-24 DIAGNOSIS — E039 Hypothyroidism, unspecified: Secondary | ICD-10-CM | POA: Diagnosis not present

## 2020-07-24 DIAGNOSIS — Z7901 Long term (current) use of anticoagulants: Secondary | ICD-10-CM | POA: Diagnosis not present

## 2020-07-24 DIAGNOSIS — Z79890 Hormone replacement therapy (postmenopausal): Secondary | ICD-10-CM | POA: Diagnosis not present

## 2020-07-24 DIAGNOSIS — E785 Hyperlipidemia, unspecified: Secondary | ICD-10-CM | POA: Insufficient documentation

## 2020-07-24 DIAGNOSIS — Z881 Allergy status to other antibiotic agents status: Secondary | ICD-10-CM | POA: Diagnosis not present

## 2020-07-24 DIAGNOSIS — I4891 Unspecified atrial fibrillation: Secondary | ICD-10-CM | POA: Insufficient documentation

## 2020-07-24 DIAGNOSIS — E782 Mixed hyperlipidemia: Secondary | ICD-10-CM | POA: Diagnosis not present

## 2020-07-24 DIAGNOSIS — Z79899 Other long term (current) drug therapy: Secondary | ICD-10-CM | POA: Diagnosis not present

## 2020-07-24 HISTORY — PX: CARDIOVERSION: SHX1299

## 2020-07-24 HISTORY — DX: Nausea with vomiting, unspecified: R11.2

## 2020-07-24 HISTORY — DX: Other specified postprocedural states: Z98.890

## 2020-07-24 SURGERY — CARDIOVERSION
Anesthesia: General

## 2020-07-24 MED ORDER — PROPOFOL 10 MG/ML IV BOLUS
INTRAVENOUS | Status: DC | PRN
  Administered 2020-07-24: 60 mg via INTRAVENOUS

## 2020-07-24 MED ORDER — SODIUM CHLORIDE 0.9 % IV SOLN: INTRAVENOUS | Status: DC

## 2020-07-24 MED ORDER — LIDOCAINE 2% (20 MG/ML) 5 ML SYRINGE
INTRAMUSCULAR | Status: DC | PRN
  Administered 2020-07-24: 60 mg via INTRAVENOUS

## 2020-07-24 NOTE — Discharge Instructions (Signed)
Electrical Cardioversion Electrical cardioversion is the delivery of a jolt of electricity to restore a normal rhythm to the heart. A rhythm that is too fast or is not regular keeps the heart from pumping well. In this procedure, sticky patches or metal paddles are placed on the chest to deliver electricity to the heart from a device. This procedure may be done in an emergency if:  There is low or no blood pressure as a result of the heart rhythm.  Normal rhythm must be restored as fast as possible to protect the brain and heart from further damage.  It may save a life. This may also be a scheduled procedure for irregular or fast heart rhythms that are not immediately life-threatening. Tell a health care provider about:  Any allergies you have.  All medicines you are taking, including vitamins, herbs, eye drops, creams, and over-the-counter medicines.  Any problems you or family members have had with anesthetic medicines.  Any blood disorders you have.  Any surgeries you have had.  Any medical conditions you have.  Whether you are pregnant or may be pregnant. What are the risks? Generally, this is a safe procedure. However, problems may occur, including:  Allergic reactions to medicines.  A blood clot that breaks free and travels to other parts of your body.  The possible return of an abnormal heart rhythm within hours or days after the procedure.  Your heart stopping (cardiac arrest). This is rare. What happens before the procedure? Medicines  Your health care provider may have you start taking: ? Blood-thinning medicines (anticoagulants) so your blood does not clot as easily. ? Medicines to help stabilize your heart rate and rhythm.  Ask your health care provider about: ? Changing or stopping your regular medicines. This is especially important if you are taking diabetes medicines or blood thinners. ? Taking medicines such as aspirin and ibuprofen. These medicines can  thin your blood. Do not take these medicines unless your health care provider tells you to take them. ? Taking over-the-counter medicines, vitamins, herbs, and supplements. General instructions  Follow instructions from your health care provider about eating or drinking restrictions.  Plan to have someone take you home from the hospital or clinic.  If you will be going home right after the procedure, plan to have someone with you for 24 hours.  Ask your health care provider what steps will be taken to help prevent infection. These may include washing your skin with a germ-killing soap. What happens during the procedure?  An IV will be inserted into one of your veins.  Sticky patches (electrodes) or metal paddles may be placed on your chest.  You will be given a medicine to help you relax (sedative).  An electrical shock will be delivered. The procedure may vary among health care providers and hospitals.   What can I expect after the procedure?  Your blood pressure, heart rate, breathing rate, and blood oxygen level will be monitored until you leave the hospital or clinic.  Your heart rhythm will be watched to make sure it does not change.  You may have some redness on the skin where the shocks were given. Follow these instructions at home:  Do not drive for 24 hours if you were given a sedative during your procedure.  Take over-the-counter and prescription medicines only as told by your health care provider.  Ask your health care provider how to check your pulse. Check it often.  Rest for 48 hours after the procedure   or as told by your health care provider.  Avoid or limit your caffeine use as told by your health care provider.  Keep all follow-up visits as told by your health care provider. This is important. Contact a health care provider if:  You feel like your heart is beating too quickly or your pulse is not regular.  You have a serious muscle cramp that does not go  away. Get help right away if:  You have discomfort in your chest.  You are dizzy or you feel faint.  You have trouble breathing or you are short of breath.  Your speech is slurred.  You have trouble moving an arm or leg on one side of your body.  Your fingers or toes turn cold or blue. Summary  Electrical cardioversion is the delivery of a jolt of electricity to restore a normal rhythm to the heart.  This procedure may be done right away in an emergency or may be a scheduled procedure if the condition is not an emergency.  Generally, this is a safe procedure.  After the procedure, check your pulse often as told by your health care provider. This information is not intended to replace advice given to you by your health care provider. Make sure you discuss any questions you have with your health care provider. Document Revised: 01/14/2019 Document Reviewed: 01/14/2019 Elsevier Patient Education  2021 [employer]  

## 2020-07-24 NOTE — Anesthesia Postprocedure Evaluation (Signed)
Anesthesia Post Note  Patient: Eddie Thompson  Procedure(s) Performed: CARDIOVERSION (N/A )     Patient location during evaluation: Endoscopy Anesthesia Type: General Level of consciousness: awake and alert Pain management: pain level controlled Vital Signs Assessment: post-procedure vital signs reviewed and stable Respiratory status: spontaneous breathing, nonlabored ventilation and respiratory function stable Cardiovascular status: blood pressure returned to baseline and stable Postop Assessment: no apparent nausea or vomiting Anesthetic complications: no   No complications documented.  Last Vitals:  Vitals:   07/24/20 0920 07/24/20 0925  BP: 125/83 123/86  Pulse: 63 61  Resp: 17 20  Temp:    SpO2: 100% 99%    Last Pain:  Vitals:   07/24/20 0925  TempSrc:   PainSc: 0-No pain                 Deveon Kisiel,W. EDMOND

## 2020-07-24 NOTE — Transfer of Care (Signed)
Immediate Anesthesia Transfer of Care Note  Patient: Eddie Thompson  Procedure(s) Performed: CARDIOVERSION (N/A )  Patient Location: PACU and Endoscopy Unit  Anesthesia Type:General  Level of Consciousness: drowsy and patient cooperative  Airway & Oxygen Therapy: Patient Spontanous Breathing and Patient connected to nasal cannula oxygen  Post-op Assessment: Report given to RN and Post -op Vital signs reviewed and stable  Post vital signs: Reviewed and stable  Last Vitals:  Vitals Value Taken Time  BP    Temp    Pulse    Resp    SpO2      Last Pain:  Vitals:   07/24/20 0829  TempSrc: Oral  PainSc: 0-No pain         Complications: No complications documented.

## 2020-07-24 NOTE — Anesthesia Preprocedure Evaluation (Signed)
Anesthesia Evaluation  Patient identified by MRN, date of birth, ID band Patient awake    Reviewed: Allergy & Precautions, H&P , NPO status , Patient's Chart, lab work & pertinent test results  Airway Mallampati: II  TM Distance: >3 FB Neck ROM: Full    Dental no notable dental hx. (+) Teeth Intact, Dental Advisory Given   Pulmonary neg pulmonary ROS,    Pulmonary exam normal breath sounds clear to auscultation       Cardiovascular hypertension, Pt. on medications + dysrhythmias Atrial Fibrillation  Rhythm:Irregular Rate:Normal     Neuro/Psych negative neurological ROS  negative psych ROS   GI/Hepatic negative GI ROS, Neg liver ROS,   Endo/Other  Hypothyroidism   Renal/GU negative Renal ROS  negative genitourinary   Musculoskeletal   Abdominal   Peds  Hematology negative hematology ROS (+)   Anesthesia Other Findings   Reproductive/Obstetrics negative OB ROS                             Anesthesia Physical Anesthesia Plan  ASA: III  Anesthesia Plan: General   Post-op Pain Management:    Induction: Intravenous  PONV Risk Score and Plan: 2 and Propofol infusion and Treatment may vary due to age or medical condition  Airway Management Planned: Mask  Additional Equipment:   Intra-op Plan:   Post-operative Plan:   Informed Consent: I have reviewed the patients History and Physical, chart, labs and discussed the procedure including the risks, benefits and alternatives for the proposed anesthesia with the patient or authorized representative who has indicated his/her understanding and acceptance.     Dental advisory given  Plan Discussed with: CRNA  Anesthesia Plan Comments:         Anesthesia Quick Evaluation

## 2020-07-24 NOTE — Anesthesia Procedure Notes (Signed)
Procedure Name: General with mask airway Date/Time: 07/24/2020 9:03 AM Performed by: Lowella Dell, CRNA Pre-anesthesia Checklist: Patient identified, Emergency Drugs available, Suction available, Patient being monitored and Timeout performed Patient Re-evaluated:Patient Re-evaluated prior to induction Oxygen Delivery Method: Ambu bag Preoxygenation: Pre-oxygenation with 100% oxygen Induction Type: IV induction Placement Confirmation: positive ETCO2 Dental Injury: Teeth and Oropharynx as per pre-operative assessment

## 2020-07-24 NOTE — CV Procedure (Signed)
Electrical Cardioversion Procedure Note Eddie Thompson Eddie Thompson [id number] 01-May-1951  Procedure: Electrical Cardioversion Indications:  Atrial Fibrillation  Procedure Details Consent: Risks of procedure as well as the alternatives and risks of each were explained to the (patient/caregiver).  Consent for procedure obtained. Time Out: Verified patient identification, verified procedure, site/side was marked, verified correct patient position, special equipment/implants available, medications/allergies/relevent history reviewed, required imaging and test results available.  Performed  Patient placed on cardiac monitor, pulse oximetry, supplemental oxygen as necessary.  Sedation given: propofol Pacer pads placed anterior and posterior chest.  Cardioverted 1 time(s).  Cardioverted at [place].  Evaluation Findings: Post procedure EKG shows: NSR Complications: None Patient did tolerate procedure well.   Skeet Latch, MD 07/24/2020, 9:12 AM

## 2020-07-24 NOTE — Interval H&P Note (Signed)
History and Physical Interval Note:  07/24/2020 8:15 AM  Eddie Thompson  has presented today for surgery, with the diagnosis of AFIB.  The various methods of treatment have been discussed with the patient and family. After consideration of risks, benefits and other options for treatment, the patient has consented to  Procedure(s): CARDIOVERSION (N/A) as a surgical intervention.  The patient's history has been reviewed, patient examined, no change in status, stable for surgery.  I have reviewed the patient's chart and labs.  Questions were answered to the patient's satisfaction.     Skeet Latch, MD

## 2020-07-26 ENCOUNTER — Encounter (HOSPITAL_COMMUNITY): Payer: Self-pay | Admitting: Cardiovascular Disease

## 2020-07-30 ENCOUNTER — Other Ambulatory Visit: Payer: Self-pay

## 2020-07-30 ENCOUNTER — Encounter: Payer: Self-pay | Admitting: Cardiology

## 2020-07-30 ENCOUNTER — Ambulatory Visit (INDEPENDENT_AMBULATORY_CARE_PROVIDER_SITE_OTHER): Payer: Medicare Other | Admitting: Cardiology

## 2020-07-30 VITALS — BP 144/82 | HR 48 | Ht 66.0 in | Wt 194.0 lb

## 2020-07-30 DIAGNOSIS — I4819 Other persistent atrial fibrillation: Secondary | ICD-10-CM | POA: Diagnosis not present

## 2020-07-30 NOTE — Progress Notes (Signed)
Electrophysiology Office Note   Date:  07/30/2020   ID:  Eddie Thompson, DOB 11/07/1950, MRN [id number]  PCP:  [name], MD  Cardiologist:  Harrington Challenger Primary Electrophysiologist:  Melis Trochez [name], MD    Chief Complaint: AF   History of Present Illness: Eddie Thompson is a 70 y.o. male who is being seen today for the evaluation of AF at the request of Eddie Records, MD. Presenting today for electrophysiology evaluation.  He has a history significant for atrial fibrillation, hypertension, hyperlipidemia, hypothyroidism.  His symptoms are shortness of breath with activity.  He has had no chest pain.  Echo showed a normal ejection fraction.  Unfortunately he has had more frequent episodes of atrial fibrillation and has had two cardioversions in the last year.  Today, he denies symptoms of palpitations, chest pain, shortness of breath, orthopnea, PND, lower extremity edema, claudication, dizziness, presyncope, syncope, bleeding, or neurologic sequela. The patient is tolerating medications without difficulties. He feels well today. When he is in atrial fibrillation, his main symptoms are shortness of breath. He states that when he is playing with his grandchildren, he has to rest quite often. He does not have much fatigue associated with his atrial fibrillation. He does feel better in sinus rhythm.   Past Medical History:  Diagnosis Date  . Actinic keratosis   . Atrial flutter ([place])   . Gout   . HTN (hypertension)   . Hypothyroidism   . Mixed hyperlipidemia   . PONV (postoperative nausea and vomiting)    Past Surgical History:  Procedure Laterality Date  . CARDIOVERSION N/A 11/15/2019   Procedure: CARDIOVERSION;  Surgeon: Buford Dresser, MD;  Location: [facility] ENDOSCOPY;  Service: Cardiovascular;  Laterality: N/A;  . CARDIOVERSION N/A 07/24/2020   Procedure: CARDIOVERSION;  Surgeon: Skeet Latch, MD;  Location: [facility] ENDOSCOPY;  Service: Cardiovascular;  Laterality:  N/A;     Current Outpatient Medications  Medication Sig Dispense Refill  . allopurinol (ZYLOPRIM) 100 MG tablet Take 100 mg by mouth daily.    Marland Kitchen alum hydroxide-mag trisilicate (GAVISCON) 62-83 MG CHEW chewable tablet Chew 2 tablets by mouth daily as needed for indigestion or heartburn.    Marland Kitchen atorvastatin (LIPITOR) 40 MG tablet Take 40 mg by mouth daily.    Marland Kitchen ELIQUIS 5 MG TABS tablet TAKE 1 TABLET BY MOUTH TWICE A DAY 60 tablet 5  . famotidine (PEPCID) 20 MG tablet Take 20 mg by mouth daily.    Marland Kitchen levothyroxine (SYNTHROID) 100 MCG tablet Take 100 mcg by mouth daily before breakfast.    . losartan-hydrochlorothiazide (HYZAAR) 100-12.5 MG tablet Take 1 tablet by mouth daily.    . Metoprolol Succinate 100 MG CS24 Take 100 mg by mouth daily.     . Multiple Vitamin (MULTIVITAMIN) capsule Take 1 capsule by mouth daily.    . Omega-3 Fatty Acids (FISH OIL) 1000 MG CAPS Take 1,000-2,000 mg by mouth See admin instructions. Alternate taking 1000 mg one day then 2000 mg the next    . pyridOXINE (VITAMIN B-6) 100 MG tablet Take 100 mg by mouth daily.     No current facility-administered medications for this visit.    Allergies:   Dolobid [diflunisal], Other, and Tetracyclines & related   Social History:  The patient  reports that he has never smoked. He has never used smokeless tobacco. He reports that he does not drink alcohol and does not use drugs.   Family History:  The patient's family history includes Heart attack in his  father.    ROS:  Please see the history of present illness.   Otherwise, review of systems is positive for none.   All other systems are reviewed and negative.    PHYSICAL EXAM: VS:  BP (!) 144/82   Pulse (!) 48   Ht 5\' 6"  (1.676 m)   Wt 194 lb (88 kg)   SpO2 98%   BMI 31.31 kg/m  , BMI Body mass index is 31.31 kg/m. GEN: Well nourished, well developed, in no acute distress  HEENT: normal  Neck: no JVD, carotid bruits, or masses Cardiac: RRR; no murmurs, rubs, or  gallops,no edema  Respiratory:  clear to auscultation bilaterally, normal work of breathing GI: soft, nontender, nondistended, + BS MS: no deformity or atrophy  Skin: warm and dry Neuro:  Strength and sensation are intact Psych: euthymic mood, full affect  EKG:  EKG is ordered today. Personal review of the ekg ordered shows sinus rhythm, rate 48  Recent Labs: 07/16/2020: BUN 19; Creatinine, Ser 1.15; Hemoglobin 16.6; Platelets 199; Potassium 4.3; Sodium 143    Lipid Panel  No results found for: CHOL, TRIG, HDL, CHOLHDL, VLDL, LDLCALC, LDLDIRECT   Wt Readings from Last 3 Encounters:  07/30/20 194 lb (88 kg)  07/24/20 194 lb (88 kg)  07/16/20 194 lb 12.8 oz (88.4 kg)      Other studies Reviewed: Additional studies/ Thompson that were reviewed today include: TTE 10/03/19  Review of the above Thompson today demonstrates:  1. Left ventricular ejection fraction, by estimation, is 60 to 65%. The  left ventricle has normal function. The left ventricle has no regional  wall motion abnormalities. There is mild left ventricular hypertrophy.  Left ventricular diastolic parameters  are indeterminate.  2. Right ventricular systolic function is mildly reduced. The right  ventricular size is mildly enlarged. Tricuspid regurgitation signal is  inadequate for assessing PA pressure.  3. Left atrial size was mild to moderately dilated.  4. Right atrial size was mildly dilated.  5. The mitral valve is normal in structure. Mild to moderate mitral valve  regurgitation. No evidence of mitral stenosis.  6. The aortic valve is tricuspid. Aortic valve regurgitation is not  visualized. No aortic stenosis is present.  7. Aortic dilatation noted. There is mild dilatation of the ascending  aorta measuring 39 mm.  8. The inferior vena cava is normal in size with greater than 50%  respiratory variability, suggesting right atrial pressure of 3 mmHg.  9. The patient was in atrial fibrillation.     ASSESSMENT AND PLAN:  1.  Persistent atrial fibrillation: Currently on Eliquis and metoprolol.  CHA2DS2-VASc of 2. He is overall feeling well today. He has shortness of breath associated with his atrial fibrillation. I have offered him the option of no medications or ablation, flecainide, or ablation. He would like to think about his options further and Ivis Henneman get back to [place] with an answer. He does state that he feels like he needs something to keep him in a rhythm.  2.  Hypertension: Mildly elevated today. Plan per primary cardiology.  3.  Hyperlipidemia: Statin per primary cardiology.  Case discussed with primary cardiology  Current medicines are reviewed at length with the patient today.   The patient does not have concerns regarding his medicines.  The following changes were made today:  none  Labs/ tests ordered today include:  Orders Placed This Encounter  Procedures  . EKG 12-Lead     Disposition:   FU with Laymond Postle  3 months  Signed, Demaryius Imran [name], MD  07/30/2020 10:13 AM     [facility] [address] [place] [address] [phone] (office) [phone] (fax)

## 2020-07-30 NOTE — Patient Instructions (Signed)
Medication Instructions:  Your physician recommends that you continue on your current medications as directed. Please refer to the Current Medication list given to you today.  *If you need a refill on your cardiac medications before your next appointment, please call your pharmacy*   Lab Work: None ordered   Testing/Procedures: None ordered   Follow-Up: At [facility], you and your health needs are our priority.  As part of our continuing mission to provide you with exceptional heart care, we have created designated Provider Care Teams.  These Care Teams include your primary Cardiologist (physician) and Advanced Practice Providers (APPs -  Physician Assistants and Nurse Practitioners) who all work together to provide you with the care you need, when you need it.  Your next appointment:   3 month(s)  The format for your next appointment:   In Person  Provider:   Allegra Lai, MD    Thank you for choosing [facility]!!   Trinidad Curet, RN [phone]   Other Instructions  Flecainide tablets What is this medicine? FLECAINIDE (FLEK a nide) is an antiarrhythmic drug. This medicine is used to prevent irregular heart rhythm. It can also slow down fast heartbeats called tachycardia. This medicine may be used for other purposes; ask your health care provider or pharmacist if you have questions. COMMON BRAND NAME(S): Tambocor What should I tell my health care provider before I take this medicine? They need to know if you have any of these conditions:  abnormal levels of potassium in the blood  heart disease including heart rhythm and heart rate problems  kidney or liver disease  recent heart attack  an unusual or allergic reaction to flecainide, local anesthetics, other medicines, foods, dyes, or preservatives  pregnant or trying to get pregnant  breast-feeding How should I use this medicine? Take this medicine by mouth with a glass of water. Follow the  directions on the prescription label. You can take this medicine with or without food. Take your doses at regular intervals. Do not take your medicine more often than directed. Do not stop taking this medicine suddenly. This may cause serious, heart-related side effects. If your doctor wants you to stop the medicine, the dose may be slowly lowered over time to avoid any side effects. Talk to your pediatrician regarding the use of this medicine in children. While this drug may be prescribed for children as young as 1 year of age for selected conditions, precautions do apply. Overdosage: If you think you have taken too much of this medicine contact a poison control center or emergency room at once. NOTE: This medicine is only for you. Do not share this medicine with others. What if I miss a dose? If you miss a dose, take it as soon as you can. If it is almost time for your next dose, take only that dose. Do not take double or extra doses. What may interact with this medicine? Do not take this medicine with any of the following medications:  amoxapine  arsenic trioxide  certain antibiotics like clarithromycin, erythromycin, gatifloxacin, gemifloxacin, levofloxacin, moxifloxacin, sparfloxacin, or troleandomycin  certain antidepressants called tricyclic antidepressants like amitriptyline, imipramine, or nortriptyline  certain medicines to control heart rhythm like disopyramide, encainide, moricizine, procainamide, propafenone, and quinidine  cisapride  delavirdine  droperidol  haloperidol  haw[place]  imatinib  levomethadyl  maprotiline  medicines for malaria like chloroquine and halofantrine  pentamidine  phenothiazines like chlorpromazine, mesoridazine, prochlorperazine, thioridazine  pimozide  quinine  ranolazine  ritonavir  sertindole  This medicine may also interact with the following medications:  cimetidine  dofetilide  medicines for angina or high blood  pressure  medicines to control heart rhythm like amiodarone and digoxin  ziprasidone This list may not describe all possible interactions. Give your health care provider a list of all the medicines, herbs, non-prescription drugs, or dietary supplements you use. Also tell them if you smoke, drink alcohol, or use illegal drugs. Some items may interact with your medicine. What should I watch for while using this medicine? Visit your doctor or health care professional for regular checks on your progress. Because your condition and the use of this medicine carries some risk, it is a good idea to carry an identification card, necklace or bracelet with details of your condition, medications and doctor or health care professional. Check your blood pressure and pulse rate regularly. Ask your health care professional what your blood pressure and pulse rate should be, and when you should contact him or her. Your doctor or health care professional also may schedule regular blood tests and electrocardiograms to check your progress. You may get drowsy or dizzy. Do not drive, use machinery, or do anything that needs mental alertness until you know how this medicine affects you. Do not stand or sit up quickly, especially if you are an older patient. This reduces the risk of dizzy or fainting spells. Alcohol can make you more dizzy, increase flushing and rapid heartbeats. Avoid alcoholic drinks. What side effects may I notice from receiving this medicine? Side effects that you should report to your doctor or health care professional as soon as possible:  chest pain, continued irregular heartbeats  difficulty breathing  swelling of the legs or feet  trembling, shaking  unusually weak or tired Side effects that usually do not require medical attention (report to your doctor or health care professional if they continue or are bothersome):  blurred vision  constipation  headache  nausea, vomiting  stomach  pain This list may not describe all possible side effects. Call your doctor for medical advice about side effects. You may report side effects to FDA at 1-800-FDA-1088. Where should I keep my medicine? Keep out of the reach of children. Store at room temperature between 15 and 30 degrees C (59 and 86 degrees F). Protect from light. Keep container tightly closed. Throw away any unused medicine after the expiration date. NOTE: This sheet is a summary. It may not cover all possible information. If you have questions about this medicine, talk to your doctor, pharmacist, or health care provider.  2021 Elsevier/Gold Standard (2018-06-04 11:41:38)      Cardiac Ablation Cardiac ablation is a procedure to destroy, or ablate, a small amount of heart tissue in very specific places. The heart has many electrical connections. Sometimes these connections are abnormal and can cause the heart to beat very fast or irregularly. Ablating some of the areas that cause problems can improve the heart's rhythm or return it to normal. Ablation may be done for people who:  Have Wolff-Parkinson-White syndrome.  Have fast heart rhythms (tachycardia).  Have taken medicines for an abnormal heart rhythm (arrhythmia) that were not effective or caused side effects.  Have a high-risk heartbeat that may be life-threatening. During the procedure, a small incision is made in the neck or the groin, and a long, thin tube (catheter) is inserted into the incision and moved to the heart. Small devices (electrodes) on the tip of the catheter will send out electrical currents. A type  of X-ray (fluoroscopy) will be used to help guide the catheter and to provide images of the heart. Tell a health care provider about:  Any allergies you have.  All medicines you are taking, including vitamins, herbs, eye drops, creams, and over-the-counter medicines.  Any problems you or family members have had with anesthetic medicines.  Any blood  disorders you have.  Any surgeries you have had.  Any medical conditions you have, such as kidney failure.  Whether you are pregnant or may be pregnant. What are the risks? Generally, this is a safe procedure. However, problems may occur, including:  Infection.  Bruising and bleeding at the catheter insertion site.  Bleeding into the chest, especially into the sac that surrounds the heart. This is a serious complication.  Stroke or blood clots.  Damage to nearby structures or organs.  Allergic reaction to medicines or dyes.  Need for a permanent pacemaker if the normal electrical system is damaged. A pacemaker is a small computer that sends electrical signals to the heart and helps your heart beat normally.  The procedure not being fully effective. This may not be recognized until months later. Repeat ablation procedures are sometimes done. What happens before the procedure? Medicines Ask your health care provider about:  Changing or stopping your regular medicines. This is especially important if you are taking diabetes medicines or blood thinners.  Taking medicines such as aspirin and ibuprofen. These medicines can thin your blood. Do not take these medicines unless your health care provider tells you to take them.  Taking over-the-counter medicines, vitamins, herbs, and supplements. General instructions  Follow instructions from your health care provider about eating or drinking restrictions.  Plan to have someone take you home from the hospital or clinic.  If you will be going home right after the procedure, plan to have someone with you for 24 hours.  Ask your health care provider what steps will be taken to prevent infection. What happens during the procedure?  An IV will be inserted into one of your veins.  You will be given a medicine to help you relax (sedative).  The skin on your neck or groin will be numbed.  An incision will be made in your neck or your  groin.  A needle will be inserted through the incision and into a large vein in your neck or groin.  A catheter will be inserted into the needle and moved to your heart.  Dye may be injected through the catheter to help your surgeon see the area of the heart that needs treatment.  Electrical currents will be sent from the catheter to ablate heart tissue in desired areas. There are three types of energy that may be used to do this: ? Heat (radiofrequency energy). ? Laser energy. ? Extreme cold (cryoablation).  When the tissue has been ablated, the catheter will be removed.  Pressure will be held on the insertion area to prevent a lot of bleeding.  A bandage (dressing) will be placed over the insertion area. The exact procedure may vary among health care providers and hospitals.   What happens after the procedure?  Your blood pressure, heart rate, breathing rate, and blood oxygen level will be monitored until you leave the hospital or clinic.  Your insertion area will be monitored for bleeding. You will need to lie still for a few hours to ensure that you do not bleed from the insertion area.  Do not drive for 24 hours or as long as  told by your health care provider. Summary  Cardiac ablation is a procedure to destroy, or ablate, a small amount of heart tissue using an electrical current. This procedure can improve the heart rhythm or return it to normal.  Tell your health care provider about any medical conditions you may have and all medicines you are taking to treat them.  This is a safe procedure, but problems may occur. Problems may include infection, bruising, damage to nearby organs or structures, or allergic reactions to medicines.  Follow your health care provider's instructions about eating and drinking before the procedure. You may also be told to change or stop some of your medicines.  After the procedure, do not drive for 24 hours or as long as told by your health care  provider. This information is not intended to replace advice given to you by your health care provider. Make sure you discuss any questions you have with your health care provider. Document Revised: 04/22/2019 Document Reviewed: 04/22/2019 Elsevier Patient Education  [place].

## 2020-09-15 ENCOUNTER — Ambulatory Visit: Payer: Medicare Other | Admitting: Internal Medicine

## 2020-09-17 ENCOUNTER — Other Ambulatory Visit: Payer: Self-pay | Admitting: Internal Medicine

## 2020-09-18 NOTE — Telephone Encounter (Signed)
Pt last saw Dr Curt Bears 07/30/20, last labs 07/16/20 Creat 1.15, age 71, weight 88kg, based on specified criteria pt is on appropriate dosage of Eliquis 5mg  BID.  Will refill rx.

## 2020-10-05 DIAGNOSIS — J069 Acute upper respiratory infection, unspecified: Secondary | ICD-10-CM | POA: Diagnosis not present

## 2020-10-05 DIAGNOSIS — R0981 Nasal congestion: Secondary | ICD-10-CM | POA: Diagnosis not present

## 2020-10-05 DIAGNOSIS — J209 Acute bronchitis, unspecified: Secondary | ICD-10-CM | POA: Diagnosis not present

## 2020-11-03 ENCOUNTER — Ambulatory Visit (INDEPENDENT_AMBULATORY_CARE_PROVIDER_SITE_OTHER): Payer: Medicare Other | Admitting: Cardiology

## 2020-11-03 ENCOUNTER — Encounter: Payer: Self-pay | Admitting: Cardiology

## 2020-11-03 ENCOUNTER — Other Ambulatory Visit: Payer: Self-pay

## 2020-11-03 VITALS — BP 146/84 | HR 52 | Ht 66.0 in | Wt 190.0 lb

## 2020-11-03 DIAGNOSIS — I4819 Other persistent atrial fibrillation: Secondary | ICD-10-CM

## 2020-11-03 NOTE — Progress Notes (Signed)
Electrophysiology Office Note   Date:  11/03/2020   ID:  Eddie Thompson, DOB [birth year], MRN [id number]  PCP:  Eddie Cruel, MD  Cardiologist:  Eddie Thompson Primary Electrophysiologist:  Eddie Thompson Eddie Leeds, MD    Chief Complaint: AF   History of Present Illness: Eddie Thompson is a 70 y.o. male who is being seen today for the evaluation of AF at the request of Eddie Cruel, MD. Presenting today for electrophysiology evaluation.  He has a history significant for atrial fibrillation, hypertension, hyperlipidemia, hypothyroidism.  His symptoms are shortness of breath with activity.  He has had no chest pain.  Echo shows a normal ejection fraction.  He has developed more frequent episodes of atrial fibrillation and has had 2 cardioversions in the last year.  Today, denies symptoms of palpitations, chest pain, shortness of breath, orthopnea, PND, lower extremity edema, claudication, dizziness, presyncope, syncope, bleeding, or neurologic sequela. The patient is tolerating medications without difficulties.  Since last being seen he has done well.  He has noted no further episodes of atrial fibrillation.  He has no acute complaints at this time.  He is able to do all of his daily activities and is without restriction.  His atrial fibrillation symptoms are shortness of breath and he has had none of this.  Past Medical History:  Diagnosis Date  . Actinic keratosis   . Atrial flutter ([place])   . Gout   . HTN (hypertension)   . Hypothyroidism   . Mixed hyperlipidemia   . PONV (postoperative nausea and vomiting)    Past Surgical History:  Procedure Laterality Date  . CARDIOVERSION N/A 11/15/2019   Procedure: CARDIOVERSION;  Surgeon: Buford Dresser, MD;  Location: [facility] ENDOSCOPY;  Service: Cardiovascular;  Laterality: N/A;  . CARDIOVERSION N/A 07/24/2020   Procedure: CARDIOVERSION;  Surgeon: Skeet Latch, MD;  Location: [facility] ENDOSCOPY;  Service: Cardiovascular;  Laterality: N/A;      Current Outpatient Medications  Medication Sig Dispense Refill  . allopurinol (ZYLOPRIM) 100 MG tablet Take 100 mg by mouth daily.    Marland Kitchen alum hydroxide-mag trisilicate (GAVISCON) 18-29 MG CHEW chewable tablet Chew 2 tablets by mouth daily as needed for indigestion or heartburn.    Marland Kitchen atorvastatin (LIPITOR) 40 MG tablet Take 40 mg by mouth daily.    Marland Kitchen ELIQUIS 5 MG TABS tablet TAKE 1 TABLET BY MOUTH TWICE A DAY 60 tablet 5  . famotidine (PEPCID) 20 MG tablet Take 20 mg by mouth daily.    Marland Kitchen levothyroxine (SYNTHROID) 100 MCG tablet Take 100 mcg by mouth daily before breakfast.    . losartan-hydrochlorothiazide (HYZAAR) 100-12.5 MG tablet Take 1 tablet by mouth daily.    . Metoprolol Succinate 100 MG CS24 Take 100 mg by mouth daily.     . Multiple Vitamin (MULTIVITAMIN) capsule Take 1 capsule by mouth daily.    . Omega-3 Fatty Acids (FISH OIL) 1000 MG CAPS Take 1,000-2,000 mg by mouth See admin instructions. Alternate taking 1000 mg one day then 2000 mg the next    . pyridOXINE (VITAMIN B-6) 100 MG tablet Take 100 mg by mouth daily.     No current facility-administered medications for this visit.    Allergies:   Dolobid [diflunisal], Other, and Tetracyclines & related   Social History:  The patient  reports that he has never smoked. He has never used smokeless tobacco. He reports that he does not drink alcohol and does not use drugs.   Family History:  The patient's family history  includes Heart attack in his father.   ROS:  Please see the history of present illness.   Otherwise, review of systems is positive for none.   All other systems are reviewed and negative.   PHYSICAL EXAM: VS:  BP (!) 146/84   Pulse (!) 52   Ht 5\' 6"  (1.676 m)   Wt 190 lb (86.2 kg)   SpO2 98%   BMI 30.67 kg/m  , BMI Body mass index is 30.67 kg/m. GEN: Well nourished, well developed, in no acute distress  HEENT: normal  Neck: no JVD, carotid bruits, or masses Cardiac: RRR; no murmurs, rubs, or gallops,no  edema  Respiratory:  clear to auscultation bilaterally, normal work of breathing GI: soft, nontender, nondistended, + BS MS: no deformity or atrophy  Skin: warm and dry Neuro:  Strength and sensation are intact Psych: euthymic mood, full affect  EKG:  EKG is not ordered today. Personal review of the ekg ordered 07/30/20 shows sinus rhythm, rate 48   Recent Labs: 07/16/2020: BUN 19; Creatinine, Ser 1.15; Hemoglobin 16.6; Platelets 199; Potassium 4.3; Sodium 143    Lipid Panel  No results found for: CHOL, TRIG, HDL, CHOLHDL, VLDL, LDLCALC, LDLDIRECT   Wt Readings from Last 3 Encounters:  11/03/20 190 lb (86.2 kg)  07/30/20 194 lb (88 kg)  07/24/20 194 lb (88 kg)      Other studies Reviewed: Additional studies/ records that were reviewed today include: TTE 10/03/19  Review of the above records today demonstrates:  1. Left ventricular ejection fraction, by estimation, is 60 to 65%. The  left ventricle has normal function. The left ventricle has no regional  wall motion abnormalities. There is mild left ventricular hypertrophy.  Left ventricular diastolic parameters  are indeterminate.  2. Right ventricular systolic function is mildly reduced. The right  ventricular size is mildly enlarged. Tricuspid regurgitation signal is  inadequate for assessing PA pressure.  3. Left atrial size was mild to moderately dilated.  4. Right atrial size was mildly dilated.  5. The mitral valve is normal in structure. Mild to moderate mitral valve  regurgitation. No evidence of mitral stenosis.  6. The aortic valve is tricuspid. Aortic valve regurgitation is not  visualized. No aortic stenosis is present.  7. Aortic dilatation noted. There is mild dilatation of the ascending  aorta measuring 39 mm.  8. The inferior vena cava is normal in size with greater than 50%  respiratory variability, suggesting right atrial pressure of 3 mmHg.  9. The patient was in atrial fibrillation.     ASSESSMENT AND PLAN:  1.  Persistent atrial fibrillation: Currently on Eliquis and metoprolol.  CHA2DS2-VASc of 2.  Fortunately, he remains in normal rhythm.  At this point, he would like to avoid both ablation and medication management.  He is currently feeling well and has no complaints.  No changes.  2.  Hypertension: Mildly elevated today.  Has been well controlled in the past.  No changes.  3.  Hyperlipidemia: Statin per primary cardiology   Current medicines are reviewed at length with the patient today.   The patient does not have concerns regarding his medicines.  The following changes were made today: None  Labs/ tests ordered today include:  No orders of the defined types were placed in this encounter.    Disposition:   FU with Corbyn Wildey 6 months  Signed, Schylar Allard Eddie Leeds, MD  11/03/2020 9:47 AM     [facility] [address] [place]  [place] [address] [phone] (office) [phone] (fax)

## 2021-02-09 DIAGNOSIS — H43813 Vitreous degeneration, bilateral: Secondary | ICD-10-CM | POA: Diagnosis not present

## 2021-02-09 DIAGNOSIS — H524 Presbyopia: Secondary | ICD-10-CM | POA: Diagnosis not present

## 2021-02-09 DIAGNOSIS — H2513 Age-related nuclear cataract, bilateral: Secondary | ICD-10-CM | POA: Diagnosis not present

## 2021-02-25 ENCOUNTER — Encounter: Payer: Self-pay | Admitting: *Deleted

## 2021-02-25 ENCOUNTER — Encounter: Payer: Self-pay | Admitting: Nurse Practitioner

## 2021-02-25 ENCOUNTER — Other Ambulatory Visit: Payer: Self-pay

## 2021-02-25 ENCOUNTER — Ambulatory Visit (INDEPENDENT_AMBULATORY_CARE_PROVIDER_SITE_OTHER): Payer: Medicare Other | Admitting: Cardiology

## 2021-02-25 ENCOUNTER — Encounter: Payer: Self-pay | Admitting: Cardiology

## 2021-02-25 VITALS — BP 130/88 | HR 77 | Ht 66.0 in | Wt 194.0 lb

## 2021-02-25 DIAGNOSIS — I4819 Other persistent atrial fibrillation: Secondary | ICD-10-CM | POA: Diagnosis not present

## 2021-02-25 DIAGNOSIS — I4891 Unspecified atrial fibrillation: Secondary | ICD-10-CM

## 2021-02-25 LAB — BASIC METABOLIC PANEL
BUN/Creatinine Ratio: 13 (ref 10–24)
BUN: 17 mg/dL (ref 8–27)
CO2: 27 mmol/L (ref 20–29)
Calcium: 10.2 mg/dL (ref 8.6–10.2)
Chloride: 101 mmol/L (ref 96–106)
Creatinine, Ser: 1.27 mg/dL (ref 0.76–1.27)
Glucose: 114 mg/dL — ABNORMAL HIGH (ref 65–99)
Potassium: 4.5 mmol/L (ref 3.5–5.2)
Sodium: 140 mmol/L (ref 134–144)
eGFR: 61 mL/min/{1.73_m2} (ref >59)

## 2021-02-25 LAB — CBC WITH DIFFERENTIAL/PLATELET
Basophils Absolute: 0 10*3/uL (ref 0.0–0.2)
Basos: 0 %
EOS (ABSOLUTE): 0.2 10*3/uL (ref 0.0–0.4)
Eos: 3 %
Hematocrit: 48.4 % (ref 37.5–51.0)
Hemoglobin: 17.4 g/dL (ref 13.0–17.7)
Immature Grans (Abs): 0 10*3/uL (ref 0.0–0.1)
Immature Granulocytes: 0 %
Lymphocytes Absolute: 2.2 10*3/uL (ref 0.7–3.1)
Lymphs: 32 %
MCH: 35.2 pg — ABNORMAL HIGH (ref 26.6–33.0)
MCHC: 36 g/dL — ABNORMAL HIGH (ref 31.5–35.7)
MCV: 98 fL — ABNORMAL HIGH (ref 79–97)
Monocytes Absolute: 0.6 10*3/uL (ref 0.1–0.9)
Monocytes: 8 %
Neutrophils Absolute: 3.9 10*3/uL (ref 1.4–7.0)
Neutrophils: 57 %
Platelets: 201 10*3/uL (ref 150–450)
RBC: 4.95 x10E6/uL (ref 4.14–5.80)
RDW: 13 % (ref 11.6–15.4)
WBC: 7 10*3/uL (ref 3.4–10.8)

## 2021-02-25 NOTE — Progress Notes (Signed)
Electrophysiology Office Note   Date:  02/25/2021   ID:  Eddie Thompson, DOB [birth year], MRN [id number]  PCP:  Eddie Cruel, MD  Cardiologist:  Harrington Challenger Primary Electrophysiologist:  Eddie Mccreery [name], MD    Chief Complaint: AF   History of Present Illness: Eddie Thompson is a 70 y.o. male who is being seen today for the evaluation of AF at the request of Eddie Cruel, MD. Presenting today for electrophysiology evaluation.  He has a history significant for atrial fibrillation, hypertension, hyperlipidemia, and hypothyroidism.  He had an echo that showed a normal ejection fraction.  Today, denies symptoms of  chest pain, orthopnea, PND, lower extremity edema, claudication, dizziness, presyncope, syncope, bleeding, or neurologic sequela. The patient is tolerating medications without difficulties.  Unfortunately he has gone back into atrial fibrillation.  He has palpitations and fatigue as well as some mild shortness of breath.  He feels that he would like to avoid further cardioversions and would prefer a rhythm control strategy.  He feels that he has been out of rhythm for the last few weeks.  Past Medical History:  Diagnosis Date   Actinic keratosis    Atrial flutter ([place])    Gout    HTN (hypertension)    Hypothyroidism    Mixed hyperlipidemia    PONV (postoperative nausea and vomiting)    Past Surgical History:  Procedure Laterality Date   CARDIOVERSION N/A 11/15/2019   Procedure: CARDIOVERSION;  Surgeon: Buford Dresser, MD;  Location: [place];  Service: Cardiovascular;  Laterality: N/A;   CARDIOVERSION N/A 07/24/2020   Procedure: CARDIOVERSION;  Surgeon: Skeet Latch, MD;  Location: [facility] ENDOSCOPY;  Service: Cardiovascular;  Laterality: N/A;     Current Outpatient Medications  Medication Sig Dispense Refill   allopurinol (ZYLOPRIM) 100 MG tablet Take 100 mg by mouth daily.     alum hydroxide-mag trisilicate (GAVISCON) [id number] MG CHEW chewable  tablet Chew 2 tablets by mouth daily as needed for indigestion or heartburn.     atorvastatin (LIPITOR) 40 MG tablet Take 40 mg by mouth daily.     ELIQUIS 5 MG TABS tablet TAKE 1 TABLET BY MOUTH TWICE A DAY 60 tablet 5   famotidine (PEPCID) 20 MG tablet Take 20 mg by mouth daily.     levothyroxine (SYNTHROID) 100 MCG tablet Take 100 mcg by mouth daily before breakfast.     losartan-hydrochlorothiazide (HYZAAR) 100-12.5 MG tablet Take 1 tablet by mouth daily.     Metoprolol Succinate 100 MG CS24 Take 100 mg by mouth daily.      Multiple Vitamin (MULTIVITAMIN) capsule Take 1 capsule by mouth daily.     Omega-3 Fatty Acids (FISH OIL) 1000 MG CAPS Take 1,000-2,000 mg by mouth See admin instructions. Alternate taking 1000 mg one day then 2000 mg the next     pyridOXINE (VITAMIN B-6) 100 MG tablet Take 100 mg by mouth daily.     No current facility-administered medications for this visit.    Allergies:   Dolobid [diflunisal], Other, and Tetracyclines & related   Social History:  The patient  reports that he has never smoked. He has never used smokeless tobacco. He reports that he does not drink alcohol and does not use drugs.   Family History:  The patient's family history includes Heart attack in his father.   ROS:  Please see the history of present illness.   Otherwise, review of systems is positive for none.   All other systems are reviewed and negative.  PHYSICAL EXAM: VS:  BP 130/88   Pulse 77   Ht '5\' 6"'$  (1.676 m)   Wt 194 lb (88 kg)   SpO2 98%   BMI 31.31 kg/m  , BMI Body mass index is 31.31 kg/m. GEN: Well nourished, well developed, in no acute distress  HEENT: normal  Neck: no JVD, carotid bruits, or masses Cardiac: Irregular; no murmurs, rubs, or gallops,no edema  Respiratory:  clear to auscultation bilaterally, normal work of breathing GI: soft, nontender, nondistended, + BS MS: no deformity or atrophy  Skin: warm and dry Neuro:  Strength and sensation are intact Psych:  euthymic mood, full affect  EKG:  EKG is ordered today. Personal review of the ekg ordered shows atrial fibrillation  Recent Labs: 07/16/2020: BUN 19; Creatinine, Ser 1.15; Hemoglobin 16.6; Platelets 199; Potassium 4.3; Sodium 143    Lipid Panel  No results found for: CHOL, TRIG, HDL, CHOLHDL, VLDL, LDLCALC, LDLDIRECT   Wt Readings from Last 3 Encounters:  02/25/21 194 lb (88 kg)  11/03/20 190 lb (86.2 kg)  07/30/20 194 lb (88 kg)      Other studies Reviewed: Additional studies/ records that were reviewed today include: TTE 10/03/19  Review of the above records today demonstrates:   1. Left ventricular ejection fraction, by estimation, is 60 to 65%. The  left ventricle has normal function. The left ventricle has no regional  wall motion abnormalities. There is mild left ventricular hypertrophy.  Left ventricular diastolic parameters  are indeterminate.   2. Right ventricular systolic function is mildly reduced. The right  ventricular size is mildly enlarged. Tricuspid regurgitation signal is  inadequate for assessing PA pressure.   3. Left atrial size was mild to moderately dilated.   4. Right atrial size was mildly dilated.   5. The mitral valve is normal in structure. Mild to moderate mitral valve  regurgitation. No evidence of mitral stenosis.   6. The aortic valve is tricuspid. Aortic valve regurgitation is not  visualized. No aortic stenosis is present.   7. Aortic dilatation noted. There is mild dilatation of the ascending  aorta measuring 39 mm.   8. The inferior vena cava is normal in size with greater than 50%  respiratory variability, suggesting right atrial pressure of 3 mmHg.   9. The patient was in atrial fibrillation.    ASSESSMENT AND PLAN:  1.  Persistent atrial fibrillation: Currently on Eliquis and metoprolol.  CHA2DS2-VASc of 2.  Unfortunately he is back in atrial fibrillation.  Would like to try to avoid antiarrhythmic medications.  Due to that, we Avner Stroder  plan for ablation.  Risk and benefits discussed.  He understands these risks and is agreed to the procedure.  Risk, benefits, and alternatives to EP study and radiofrequency ablation for afib were also discussed in detail today. These risks include but are not limited to stroke, bleeding, vascular damage, tamponade, perforation, damage to the esophagus, lungs, and other structures, pulmonary vein stenosis, worsening renal function, and death. The patient understands these risk and wishes to proceed.  We Chaunda Vandergriff therefore proceed with catheter ablation at the next available time.  Carto, ICE, anesthesia are requested for the procedure.  Ronita Hargreaves also obtain CT PV protocol prior to the procedure to exclude LAA thrombus and further evaluate atrial anatomy.   2.  Hypertension: Currently well controlled  3.  Hyperlipidemia: Statin per primary cardiology.  Case discussed with primary cardiology  Current medicines are reviewed at length with the patient today.   The patient does  not have concerns regarding his medicines.  The following changes were made today: none  Labs/ tests ordered today include:  Orders Placed This Encounter  Procedures   CT CARDIAC MORPH/PULM VEIN W/CM&W/O CA SCORE   CBC w/Diff   Basic Metabolic Panel (BMET)   CBC   Basic Metabolic Panel (BMET)   EKG 12-Lead      Disposition:   FU with Jeffrie Lofstrom 3 months  Signed, Dragon Thrush [name], MD  02/25/2021 11:14 AM     [facility] [address] [place] [address] [phone] (office) [phone] (fax)

## 2021-02-25 NOTE — H&P (View-Only) (Signed)
Electrophysiology Office Note   Date:  02/25/2021   ID:  Eddie Thompson, DOB [birth year], MRN [id number]  PCP:  [name], MD  Cardiologist:  Harrington Challenger Primary Electrophysiologist:  Zaria Taha [name], MD    Chief Complaint: AF   History of Present Illness: Eddie Thompson is a 70 y.o. male who is being seen today for the evaluation of AF at the request of [name], MD. Presenting today for electrophysiology evaluation.  He has a history significant for atrial fibrillation, hypertension, hyperlipidemia, and hypothyroidism.  He had an echo that showed a normal ejection fraction.  Today, denies symptoms of  chest pain, orthopnea, PND, lower extremity edema, claudication, dizziness, presyncope, syncope, bleeding, or neurologic sequela. The patient is tolerating medications without difficulties.  Unfortunately he has gone back into atrial fibrillation.  He has palpitations and fatigue as well as some mild shortness of breath.  He feels that he would like to avoid further cardioversions and would prefer a rhythm control strategy.  He feels that he has been out of rhythm for the last few weeks.  Past Medical History:  Diagnosis Date   Actinic keratosis    Atrial flutter ([place])    Gout    HTN (hypertension)    Hypothyroidism    Mixed hyperlipidemia    PONV (postoperative nausea and vomiting)    Past Surgical History:  Procedure Laterality Date   CARDIOVERSION N/A 11/15/2019   Procedure: CARDIOVERSION;  Surgeon: Buford Dresser, MD;  Location: [place];  Service: Cardiovascular;  Laterality: N/A;   CARDIOVERSION N/A 07/24/2020   Procedure: CARDIOVERSION;  Surgeon: Skeet Latch, MD;  Location: [facility] ENDOSCOPY;  Service: Cardiovascular;  Laterality: N/A;     Current Outpatient Medications  Medication Sig Dispense Refill   allopurinol (ZYLOPRIM) 100 MG tablet Take 100 mg by mouth daily.     alum hydroxide-mag trisilicate (GAVISCON) [id number] MG CHEW chewable  tablet Chew 2 tablets by mouth daily as needed for indigestion or heartburn.     atorvastatin (LIPITOR) 40 MG tablet Take 40 mg by mouth daily.     ELIQUIS 5 MG TABS tablet TAKE 1 TABLET BY MOUTH TWICE A DAY 60 tablet 5   famotidine (PEPCID) 20 MG tablet Take 20 mg by mouth daily.     levothyroxine (SYNTHROID) 100 MCG tablet Take 100 mcg by mouth daily before breakfast.     losartan-hydrochlorothiazide (HYZAAR) 100-12.5 MG tablet Take 1 tablet by mouth daily.     Metoprolol Succinate 100 MG CS24 Take 100 mg by mouth daily.      Multiple Vitamin (MULTIVITAMIN) capsule Take 1 capsule by mouth daily.     Omega-3 Fatty Acids (FISH OIL) 1000 MG CAPS Take 1,000-2,000 mg by mouth See admin instructions. Alternate taking 1000 mg one day then 2000 mg the next     pyridOXINE (VITAMIN B-6) 100 MG tablet Take 100 mg by mouth daily.     No current facility-administered medications for this visit.    Allergies:   Dolobid [diflunisal], Other, and Tetracyclines & related   Social History:  The patient  reports that he has never smoked. He has never used smokeless tobacco. He reports that he does not drink alcohol and does not use drugs.   Family History:  The patient's family history includes Heart attack in his father.   ROS:  Please see the history of present illness.   Otherwise, review of systems is positive for none.   All other systems are reviewed and negative.  PHYSICAL EXAM: VS:  BP 130/88   Pulse 77   Ht '5\' 6"'$  (1.676 m)   Wt 194 lb (88 kg)   SpO2 98%   BMI 31.31 kg/m  , BMI Body mass index is 31.31 kg/m. GEN: Well nourished, well developed, in no acute distress  HEENT: normal  Neck: no JVD, carotid bruits, or masses Cardiac: Irregular; no murmurs, rubs, or gallops,no edema  Respiratory:  clear to auscultation bilaterally, normal work of breathing GI: soft, nontender, nondistended, + BS MS: no deformity or atrophy  Skin: warm and dry Neuro:  Strength and sensation are intact Psych:  euthymic mood, full affect  EKG:  EKG is ordered today. Personal review of the ekg ordered shows atrial fibrillation  Recent Labs: 07/16/2020: BUN 19; Creatinine, Ser 1.15; Hemoglobin 16.6; Platelets 199; Potassium 4.3; Sodium 143    Lipid Panel  No results found for: CHOL, TRIG, HDL, CHOLHDL, VLDL, LDLCALC, LDLDIRECT   Wt Readings from Last 3 Encounters:  02/25/21 194 lb (88 kg)  11/03/20 190 lb (86.2 kg)  07/30/20 194 lb (88 kg)      Other studies Reviewed: Additional studies/ records that were reviewed today include: TTE 10/03/19  Review of the above records today demonstrates:   1. Left ventricular ejection fraction, by estimation, is 60 to 65%. The  left ventricle has normal function. The left ventricle has no regional  wall motion abnormalities. There is mild left ventricular hypertrophy.  Left ventricular diastolic parameters  are indeterminate.   2. Right ventricular systolic function is mildly reduced. The right  ventricular size is mildly enlarged. Tricuspid regurgitation signal is  inadequate for assessing PA pressure.   3. Left atrial size was mild to moderately dilated.   4. Right atrial size was mildly dilated.   5. The mitral valve is normal in structure. Mild to moderate mitral valve  regurgitation. No evidence of mitral stenosis.   6. The aortic valve is tricuspid. Aortic valve regurgitation is not  visualized. No aortic stenosis is present.   7. Aortic dilatation noted. There is mild dilatation of the ascending  aorta measuring 39 mm.   8. The inferior vena cava is normal in size with greater than 50%  respiratory variability, suggesting right atrial pressure of 3 mmHg.   9. The patient was in atrial fibrillation.    ASSESSMENT AND PLAN:  1.  Persistent atrial fibrillation: Currently on Eliquis and metoprolol.  CHA2DS2-VASc of 2.  Unfortunately he is back in atrial fibrillation.  Would like to try to avoid antiarrhythmic medications.  Due to that, we Dolce Sylvia  plan for ablation.  Risk and benefits discussed.  He understands these risks and is agreed to the procedure.  Risk, benefits, and alternatives to EP study and radiofrequency ablation for afib were also discussed in detail today. These risks include but are not limited to stroke, bleeding, vascular damage, tamponade, perforation, damage to the esophagus, lungs, and other structures, pulmonary vein stenosis, worsening renal function, and death. The patient understands these risk and wishes to proceed.  We Rosmery Duggin therefore proceed with catheter ablation at the next available time.  Carto, ICE, anesthesia are requested for the procedure.  Tyresse Jayson also obtain CT PV protocol prior to the procedure to exclude LAA thrombus and further evaluate atrial anatomy.   2.  Hypertension: Currently well controlled  3.  Hyperlipidemia: Statin per primary cardiology.  Case discussed with primary cardiology  Current medicines are reviewed at length with the patient today.   The patient does  not have concerns regarding his medicines.  The following changes were made today: none  Labs/ tests ordered today include:  Orders Placed This Encounter  Procedures   CT CARDIAC MORPH/PULM VEIN W/CM&W/O CA SCORE   CBC w/Diff   Basic Metabolic Panel (BMET)   CBC   Basic Metabolic Panel (BMET)   EKG 12-Lead      Disposition:   FU with Thy Gullikson 3 months  Signed, Julita Ozbun [name], MD  02/25/2021 11:14 AM     [facility] [address] [place] [address] [phone] (office) [phone] (fax)

## 2021-02-25 NOTE — Patient Instructions (Addendum)
Medication Instructions:  Your physician recommends that you continue on your current medications as directed. Please refer to the Current Medication list given to you today.  *If you need a refill on your cardiac medications before your next appointment, please call your pharmacy*   Lab Work: TODAY - CBC, BMET  If you have labs (blood work) drawn today and your tests are completely normal, you will receive your results only by: [place] (if you have MyChart) OR A paper copy in the mail If you have any lab test that is abnormal or we need to change your treatment, we will call you to review the results.   Testing/Procedures: Your physician has requested that you have cardiac CT. Cardiac computed tomography (CT) is a painless test that uses an x-ray machine to take clear, detailed pictures of your heart. For further information please visit [email or web address]. Please follow instruction sheet as given.    Your physician has recommended that you have an ablation. Catheter ablation is a medical procedure used to treat some cardiac arrhythmias (irregular heartbeats). During catheter ablation, a long, thin, flexible tube is put into a blood vessel in your groin (upper thigh), or neck. This tube is called an ablation catheter. It is then guided to your heart through the blood vessel. Radio frequency waves destroy small areas of heart tissue where abnormal heartbeats may cause an arrhythmia to start. Please see the instruction sheet given to you today.  Follow-Up: At [facility], you and your health needs are our priority.  As part of our continuing mission to provide you with exceptional heart care, we have created designated Provider Care Teams.  These Care Teams include your primary Cardiologist (physician) and Advanced Practice Providers (APPs -  Physician Assistants and Nurse Practitioners) who all work together to provide you with the care you need, when you need it.  Cardiac  Ablation Cardiac ablation is a procedure to destroy (ablate) some heart tissue that is sending bad signals. These bad signals cause problems in heart rhythm. The heart has many areas that make these signals. If there are problems in these areas, they can make the heart beat in a way that is not normal. Destroying some tissues can help make the heart rhythm normal. Tell your doctor about: Any allergies you have. All medicines you are taking. These include vitamins, herbs, eye drops, creams, and over-the-counter medicines. Any problems you or family members have had with medicines that make you fall asleep (anesthetics). Any blood disorders you have. Any surgeries you have had. Any medical conditions you have, such as kidney failure. Whether you are pregnant or may be pregnant. What are the risks? This is a safe procedure. But problems may occur, including: Infection. Bruising and bleeding. Bleeding into the chest. Stroke or blood clots. Damage to nearby areas of your body. Allergies to medicines or dyes. The need for a pacemaker if the normal system is damaged. Failure of the procedure to treat the problem. What happens before the procedure? Medicines Ask your doctor about: Changing or stopping your normal medicines. This is important. Taking aspirin and ibuprofen. Do not take these medicines unless your doctor tells you to take them. Taking other medicines, vitamins, herbs, and supplements. General instructions Follow instructions from your doctor about what you cannot eat or drink. Plan to have someone take you home from the hospital or clinic. If you will be going home right after the procedure, plan to have someone with you for 24 hours. Ask your doctor  what steps will be taken to prevent infection. What happens during the procedure?  An IV tube will be put into one of your veins. You will be given a medicine to help you relax. The skin on your neck or groin will be numbed. A  cut (incision) will be made in your neck or groin. A needle will be put through your cut and into a large vein. A tube (catheter) will be put into the needle. The tube will be moved to your heart. Dye may be put through the tube. This helps your doctor see your heart. Small devices (electrodes) on the tube will send out signals. A type of energy will be used to destroy some heart tissue. The tube will be taken out. Pressure will be held on your cut. This helps stop bleeding. A bandage will be put over your cut. The exact procedure may vary among doctors and hospitals. What happens after the procedure? You will be watched until you leave the hospital or clinic. This includes checking your heart rate, breathing rate, oxygen, and blood pressure. Your cut will be watched for bleeding. You will need to lie still for a few hours. Do not drive for 24 hours or as long as your doctor tells you. Summary Cardiac ablation is a procedure to destroy some heart tissue. This is done to treat heart rhythm problems. Tell your doctor about any medical conditions you may have. Tell him or her about all medicines you are taking to treat them. This is a safe procedure. But problems may occur. These include infection, bruising, bleeding, and damage to nearby areas of your body. Follow what your doctor tells you about food and drink. You may also be told to change or stop some of your medicines. After the procedure, do not drive for 24 hours or as long as your doctor tells you. This information is not intended to replace advice given to you by your health care provider. Make sure you discuss any questions you have with your health care provider. Document Revised: 05/16/2019 Document Reviewed: 05/16/2019 Elsevier Patient Education  2022 [employer].

## 2021-03-09 ENCOUNTER — Other Ambulatory Visit: Payer: Self-pay | Admitting: Cardiology

## 2021-03-09 NOTE — Telephone Encounter (Signed)
Prescription refill request for Eliquis received.  Indication: afib  Last office visit: 02/25/2021 Scr: 1.15, 07/16/2020 Age: 70 yo   Weight: 88 kg   Refill sent.

## 2021-03-15 ENCOUNTER — Telehealth (HOSPITAL_COMMUNITY): Payer: Self-pay | Admitting: Emergency Medicine

## 2021-03-15 NOTE — Telephone Encounter (Signed)
Reaching out to patient to offer assistance regarding upcoming cardiac imaging study; pt verbalizes understanding of appt date/time, parking situation and where to check in, pre-test NPO status and medications ordered, and verified current allergies; name and call back number provided for further questions should they arise Ashly Yepez RN Navigator Cardiac Imaging [facility] [phone] office [phone] cell  Denies iv issues Denies claustro  

## 2021-03-15 NOTE — Telephone Encounter (Signed)
Attempted to call patient regarding upcoming cardiac CT appointment. °Left message on voicemail with name and callback number °Ashe Gago RN Navigator Cardiac Imaging °[facility] °[phone] Office °[phone] Cell ° °

## 2021-03-17 ENCOUNTER — Ambulatory Visit (HOSPITAL_COMMUNITY)
Admission: RE | Admit: 2021-03-17 | Discharge: 2021-03-17 | Disposition: A | Discharge status: Home / Self Care | Payer: Medicare Other | Source: Ambulatory Visit | Attending: Cardiology | Admitting: Cardiology

## 2021-03-17 ENCOUNTER — Other Ambulatory Visit: Payer: Self-pay

## 2021-03-17 ENCOUNTER — Encounter (HOSPITAL_COMMUNITY): Payer: Self-pay

## 2021-03-17 DIAGNOSIS — I4891 Unspecified atrial fibrillation: Secondary | ICD-10-CM

## 2021-03-17 MED ORDER — IOHEXOL 350 MG/ML SOLN
80.0000 mL | Freq: Once | INTRAVENOUS | Status: AC | PRN
  Administered 2021-03-17: 80 mL via INTRAVENOUS

## 2021-03-17 MED ORDER — METOPROLOL TARTRATE 5 MG/5ML IV SOLN
INTRAVENOUS | Status: AC
  Filled 2021-03-17: qty 10

## 2021-03-17 MED ORDER — METOPROLOL TARTRATE 5 MG/5ML IV SOLN
10.0000 mg | Freq: Once | INTRAVENOUS | Status: AC
  Administered 2021-03-17: 10 mg via INTRAVENOUS

## 2021-03-17 MED ORDER — DILTIAZEM HCL 25 MG/5ML IV SOLN
INTRAVENOUS | Status: AC
  Administered 2021-03-17: 10 mg via INTRAVENOUS
  Filled 2021-03-17: qty 5

## 2021-03-17 MED ORDER — DILTIAZEM HCL 25 MG/5ML IV SOLN: 10.0000 mg | Freq: Once | INTRAVENOUS | Status: AC

## 2021-03-18 DIAGNOSIS — Z125 Encounter for screening for malignant neoplasm of prostate: Secondary | ICD-10-CM | POA: Diagnosis not present

## 2021-03-18 DIAGNOSIS — E782 Mixed hyperlipidemia: Secondary | ICD-10-CM | POA: Diagnosis not present

## 2021-03-18 DIAGNOSIS — E039 Hypothyroidism, unspecified: Secondary | ICD-10-CM | POA: Diagnosis not present

## 2021-03-18 DIAGNOSIS — M109 Gout, unspecified: Secondary | ICD-10-CM | POA: Diagnosis not present

## 2021-03-18 DIAGNOSIS — I10 Essential (primary) hypertension: Secondary | ICD-10-CM | POA: Diagnosis not present

## 2021-03-23 DIAGNOSIS — M109 Gout, unspecified: Secondary | ICD-10-CM | POA: Diagnosis not present

## 2021-03-23 DIAGNOSIS — Z125 Encounter for screening for malignant neoplasm of prostate: Secondary | ICD-10-CM | POA: Diagnosis not present

## 2021-03-23 DIAGNOSIS — E039 Hypothyroidism, unspecified: Secondary | ICD-10-CM | POA: Diagnosis not present

## 2021-03-23 DIAGNOSIS — I10 Essential (primary) hypertension: Secondary | ICD-10-CM | POA: Diagnosis not present

## 2021-03-23 DIAGNOSIS — D6869 Other thrombophilia: Secondary | ICD-10-CM | POA: Diagnosis not present

## 2021-03-23 DIAGNOSIS — R7301 Impaired fasting glucose: Secondary | ICD-10-CM | POA: Diagnosis not present

## 2021-03-23 DIAGNOSIS — Z0000 Encounter for general adult medical examination without abnormal findings: Secondary | ICD-10-CM | POA: Diagnosis not present

## 2021-03-23 DIAGNOSIS — E782 Mixed hyperlipidemia: Secondary | ICD-10-CM | POA: Diagnosis not present

## 2021-03-23 NOTE — Pre-Procedure Instructions (Signed)
Attempted to call patient regarding procedure instructions for tomorrow.  No answer. 

## 2021-03-23 NOTE — Anesthesia Preprocedure Evaluation (Addendum)
Anesthesia Evaluation  Patient identified by MRN, date of birth, ID band Patient awake    Reviewed: Allergy & Precautions, H&P , NPO status , Patient's Chart, lab work & pertinent test results  History of Anesthesia Complications (+) PONV and history of anesthetic complications  Airway Mallampati: II  TM Distance: >3 FB Neck ROM: Full    Dental no notable dental hx. (+) Dental Advisory Given   Pulmonary neg pulmonary ROS,    Pulmonary exam normal breath sounds clear to auscultation       Cardiovascular hypertension, Pt. on medications Normal cardiovascular exam+ dysrhythmias Atrial Fibrillation + Valvular Problems/Murmurs MR  Rhythm:Regular Rate:Normal  Echo 09/2019 1. Left ventricular ejection fraction, by estimation, is 60 to 65%. The left ventricle has normal function. The left ventricle has no regional wall motion abnormalities. There is mild left ventricular hypertrophy. Left ventricular diastolic parameters are indeterminate.  2. Right ventricular systolic function is mildly reduced. The right ventricular size is mildly enlarged. Tricuspid regurgitation signal is inadequate for assessing PA pressure.  3. Left atrial size was mild to moderately dilated.  4. Right atrial size was mildly dilated.  5. The mitral valve is normal in structure. Mild to moderate mitral valve regurgitation. No evidence of mitral stenosis.  6. The aortic valve is tricuspid. Aortic valve regurgitation is not visualized. No aortic stenosis is present.  7. Aortic dilatation noted. 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  8. The inferior vena cava is normal in size with greater than 50% respiratory variability, suggesting right atrial pressure of 3 mmHg.  9. The patient was in atrial fibrillation.    Neuro/Psych negative neurological ROS  negative psych ROS   GI/Hepatic negative GI ROS, Neg liver ROS,   Endo/Other   Hypothyroidism   Renal/GU negative Renal ROS     Musculoskeletal   Abdominal   Peds  Hematology negative hematology ROS (+)   Anesthesia Other Findings   Reproductive/Obstetrics negative OB ROS                            Anesthesia Physical  Anesthesia Plan  ASA: 3  Anesthesia Plan: General   Post-op Pain Management:    Induction: Intravenous  PONV Risk Score and Plan: 3 and Treatment may vary due to age or medical condition, Midazolam, Ondansetron and Dexamethasone  Airway Management Planned: LMA  Additional Equipment: None  Intra-op Plan:   Post-operative Plan: Extubation in OR  Informed Consent: I have reviewed the patients History and Physical, chart, labs and discussed the procedure including the risks, benefits and alternatives for the proposed anesthesia with the patient or authorized representative who has indicated his/her understanding and acceptance.     Dental advisory given  Plan Discussed with: CRNA  Anesthesia Plan Comments:        Anesthesia Quick Evaluation

## 2021-03-24 ENCOUNTER — Ambulatory Visit (HOSPITAL_COMMUNITY)
Admission: RE | Admit: 2021-03-24 | Discharge: 2021-03-24 | Disposition: A | Discharge status: Home / Self Care | Payer: Medicare Other | Attending: Cardiology | Admitting: Cardiology

## 2021-03-24 ENCOUNTER — Other Ambulatory Visit: Payer: Self-pay

## 2021-03-24 ENCOUNTER — Ambulatory Visit (HOSPITAL_COMMUNITY): Payer: Medicare Other | Admitting: Certified Registered Nurse Anesthetist

## 2021-03-24 ENCOUNTER — Encounter (HOSPITAL_COMMUNITY): Payer: Self-pay | Admitting: Cardiology

## 2021-03-24 ENCOUNTER — Encounter (HOSPITAL_COMMUNITY)
Admission: RE | Disposition: A | Discharge status: Home / Self Care | Payer: Self-pay | Source: Home / Self Care | Attending: Cardiology

## 2021-03-24 DIAGNOSIS — Z7901 Long term (current) use of anticoagulants: Secondary | ICD-10-CM | POA: Diagnosis not present

## 2021-03-24 DIAGNOSIS — I10 Essential (primary) hypertension: Secondary | ICD-10-CM | POA: Diagnosis not present

## 2021-03-24 DIAGNOSIS — I4819 Other persistent atrial fibrillation: Secondary | ICD-10-CM | POA: Insufficient documentation

## 2021-03-24 DIAGNOSIS — Z79899 Other long term (current) drug therapy: Secondary | ICD-10-CM | POA: Insufficient documentation

## 2021-03-24 DIAGNOSIS — I4891 Unspecified atrial fibrillation: Secondary | ICD-10-CM | POA: Diagnosis not present

## 2021-03-24 DIAGNOSIS — E782 Mixed hyperlipidemia: Secondary | ICD-10-CM | POA: Diagnosis not present

## 2021-03-24 DIAGNOSIS — Z881 Allergy status to other antibiotic agents status: Secondary | ICD-10-CM | POA: Insufficient documentation

## 2021-03-24 DIAGNOSIS — E039 Hypothyroidism, unspecified: Secondary | ICD-10-CM | POA: Insufficient documentation

## 2021-03-24 DIAGNOSIS — Z8249 Family history of ischemic heart disease and other diseases of the circulatory system: Secondary | ICD-10-CM | POA: Insufficient documentation

## 2021-03-24 DIAGNOSIS — Z79890 Hormone replacement therapy (postmenopausal): Secondary | ICD-10-CM | POA: Diagnosis not present

## 2021-03-24 DIAGNOSIS — I4892 Unspecified atrial flutter: Secondary | ICD-10-CM | POA: Insufficient documentation

## 2021-03-24 DIAGNOSIS — Z888 Allergy status to other drugs, medicaments and biological substances status: Secondary | ICD-10-CM | POA: Diagnosis not present

## 2021-03-24 DIAGNOSIS — L570 Actinic keratosis: Secondary | ICD-10-CM | POA: Diagnosis not present

## 2021-03-24 HISTORY — PX: ATRIAL FIBRILLATION ABLATION: EP1191

## 2021-03-24 LAB — POCT ACTIVATED CLOTTING TIME
Activated Clotting Time: 457 seconds
Activated Clotting Time: 323 seconds
Activated Clotting Time: 393 seconds

## 2021-03-24 SURGERY — ATRIAL FIBRILLATION ABLATION
Anesthesia: General

## 2021-03-24 MED ORDER — SODIUM CHLORIDE 0.9 % IV SOLN: 250.0000 mL | INTRAVENOUS | Status: DC | PRN

## 2021-03-24 MED ORDER — DOBUTAMINE INFUSION FOR EP/ECHO/NUC (1000 MCG/ML)
INTRAVENOUS | Status: DC | PRN
  Administered 2021-03-24: 20 ug/kg/min via INTRAVENOUS

## 2021-03-24 MED ORDER — PROPOFOL 10 MG/ML IV BOLUS
INTRAVENOUS | Status: DC | PRN
Start: 2021-03-24 — End: 2021-03-24
  Administered 2021-03-24: 120 mg via INTRAVENOUS

## 2021-03-24 MED ORDER — PROTAMINE SULFATE 10 MG/ML IV SOLN
INTRAVENOUS | Status: DC | PRN
  Administered 2021-03-24: 40 mg via INTRAVENOUS

## 2021-03-24 MED ORDER — SODIUM CHLORIDE 0.9 % IV SOLN: INTRAVENOUS | Status: DC

## 2021-03-24 MED ORDER — AMISULPRIDE (ANTIEMETIC) 5 MG/2ML IV SOLN
INTRAVENOUS | Status: AC
  Filled 2021-03-24: qty 2

## 2021-03-24 MED ORDER — HEPARIN SODIUM (PORCINE) 1000 UNIT/ML IJ SOLN
INTRAMUSCULAR | Status: DC | PRN
  Administered 2021-03-24: 14000 [IU] via INTRAVENOUS

## 2021-03-24 MED ORDER — AMISULPRIDE (ANTIEMETIC) 5 MG/2ML IV SOLN
10.0000 mg | Freq: Once | INTRAVENOUS | Status: AC
  Administered 2021-03-24: 10 mg via INTRAVENOUS

## 2021-03-24 MED ORDER — SODIUM CHLORIDE 0.9 % IV SOLN
12.5000 mg | Freq: Once | INTRAVENOUS | Status: AC
  Administered 2021-03-24: 12.5 mg via INTRAVENOUS
  Filled 2021-03-24: qty 0.5

## 2021-03-24 MED ORDER — EPHEDRINE SULFATE 50 MG/ML IJ SOLN
INTRAMUSCULAR | Status: DC | PRN
  Administered 2021-03-24: 5 mg via INTRAVENOUS

## 2021-03-24 MED ORDER — LIDOCAINE HCL (PF) 2 % IJ SOLN
INTRAMUSCULAR | Status: DC | PRN
  Administered 2021-03-24: 80 mg via INTRADERMAL

## 2021-03-24 MED ORDER — SODIUM CHLORIDE 0.9% FLUSH: 3.0000 mL | INTRAVENOUS | Status: DC | PRN

## 2021-03-24 MED ORDER — SUGAMMADEX SODIUM 200 MG/2ML IV SOLN
INTRAVENOUS | Status: DC | PRN
  Administered 2021-03-24: 200 mg via INTRAVENOUS

## 2021-03-24 MED ORDER — ACETAMINOPHEN 325 MG PO TABS
650.0000 mg | ORAL_TABLET | ORAL | Status: DC | PRN
  Filled 2021-03-24: qty 2

## 2021-03-24 MED ORDER — HEPARIN SODIUM (PORCINE) 1000 UNIT/ML IJ SOLN
INTRAMUSCULAR | Status: AC
  Filled 2021-03-24: qty 1

## 2021-03-24 MED ORDER — PROMETHAZINE HCL 25 MG/ML IJ SOLN
6.2500 mg | INTRAMUSCULAR | Status: DC | PRN
  Filled 2021-03-24 (×3): qty 1

## 2021-03-24 MED ORDER — PHENYLEPHRINE HCL-NACL 20-0.9 MG/250ML-% IV SOLN
INTRAVENOUS | Status: DC | PRN
  Administered 2021-03-24: 25 ug/min via INTRAVENOUS

## 2021-03-24 MED ORDER — HEPARIN SODIUM (PORCINE) 1000 UNIT/ML IJ SOLN
INTRAMUSCULAR | Status: DC | PRN
  Administered 2021-03-24: 1000 [IU] via INTRAVENOUS

## 2021-03-24 MED ORDER — FENTANYL CITRATE (PF) 250 MCG/5ML IJ SOLN
INTRAMUSCULAR | Status: DC | PRN
  Administered 2021-03-24: 50 ug via INTRAVENOUS

## 2021-03-24 MED ORDER — HEPARIN (PORCINE) IN NACL 1000-0.9 UT/500ML-% IV SOLN
INTRAVENOUS | Status: DC | PRN
  Administered 2021-03-24 (×5): 500 mL

## 2021-03-24 MED ORDER — ONDANSETRON HCL 4 MG/2ML IJ SOLN
INTRAMUSCULAR | Status: DC | PRN
  Administered 2021-03-24: 4 mg via INTRAVENOUS

## 2021-03-24 MED ORDER — ROCURONIUM BROMIDE 10 MG/ML (PF) SYRINGE
PREFILLED_SYRINGE | INTRAVENOUS | Status: DC | PRN
  Administered 2021-03-24: 20 mg via INTRAVENOUS
  Administered 2021-03-24: 50 mg via INTRAVENOUS

## 2021-03-24 MED ORDER — SODIUM CHLORIDE 0.9% FLUSH: 3.0000 mL | Freq: Two times a day (BID) | INTRAVENOUS | Status: DC

## 2021-03-24 MED ORDER — DEXAMETHASONE SODIUM PHOSPHATE 10 MG/ML IJ SOLN
INTRAMUSCULAR | Status: DC | PRN
  Administered 2021-03-24: 8 mg via INTRAVENOUS

## 2021-03-24 MED ORDER — ONDANSETRON HCL 4 MG/2ML IJ SOLN
INTRAMUSCULAR | Status: AC
  Filled 2021-03-24: qty 2

## 2021-03-24 MED ORDER — APIXABAN 5 MG PO TABS
5.0000 mg | ORAL_TABLET | Freq: Once | ORAL | Status: AC
  Administered 2021-03-24: 5 mg via ORAL
  Filled 2021-03-24: qty 1

## 2021-03-24 MED ORDER — DOBUTAMINE INFUSION FOR EP/ECHO/NUC (1000 MCG/ML)
INTRAVENOUS | Status: AC
  Filled 2021-03-24: qty 250

## 2021-03-24 MED ORDER — ONDANSETRON HCL 4 MG/2ML IJ SOLN
4.0000 mg | Freq: Four times a day (QID) | INTRAMUSCULAR | Status: DC | PRN
  Administered 2021-03-24: 4 mg via INTRAVENOUS

## 2021-03-24 MED ORDER — PHENYLEPHRINE 40 MCG/ML (10ML) SYRINGE FOR IV PUSH (FOR BLOOD PRESSURE SUPPORT)
PREFILLED_SYRINGE | INTRAVENOUS | Status: DC | PRN
  Administered 2021-03-24: 120 ug via INTRAVENOUS
  Administered 2021-03-24: 80 ug via INTRAVENOUS

## 2021-03-24 SURGICAL SUPPLY — 22 items
BAG SNAP BAND KOVER 36X36 (MISCELLANEOUS) ×2 IMPLANT
BLANKET WARM UNDERBOD FULL ACC (MISCELLANEOUS) ×2 IMPLANT
CATH 8FR REPROCESSED SOUNDSTAR (CATHETERS) ×2 IMPLANT
CATH OCTARAY 2.0 F 3-3-3-3-3 (CATHETERS) ×2 IMPLANT
CATH S CIRCA THERM PROBE 10F (CATHETERS) ×2 IMPLANT
CATH SMTCH THERMOCOOL SF DF (CATHETERS) ×2 IMPLANT
CATH WEB BI DIR CSDF CRV REPRO (CATHETERS) ×2 IMPLANT
CLOSURE PERCLOSE PROSTYLE (VASCULAR PRODUCTS) ×8 IMPLANT
COVER SWIFTLINK CONNECTOR (BAG) ×2 IMPLANT
MAT PREVALON FULL STRYKER (MISCELLANEOUS) ×2 IMPLANT
NEEDLE BAYLIS TRANSSEPTAL 71CM (NEEDLE) ×2 IMPLANT
PACK EP LATEX FREE (CUSTOM PROCEDURE TRAY) ×2
PACK EP LF (CUSTOM PROCEDURE TRAY) ×1 IMPLANT
PAD PRO RADIOLUCENT 2001M-C (PAD) ×2 IMPLANT
PATCH CARTO3 (PAD) ×2 IMPLANT
SHEATH CARTO VIZIGO SM CVD (SHEATH) ×2 IMPLANT
SHEATH PINNACLE 7F 10CM (SHEATH) ×2 IMPLANT
SHEATH PINNACLE 8F 10CM (SHEATH) ×4 IMPLANT
SHEATH PINNACLE 9F 10CM (SHEATH) ×2 IMPLANT
SHEATH PROBE COVER 6X72 (BAG) ×2 IMPLANT
SHEATH SWARTZ TS SL2 63CM 8.5F (SHEATH) ×2 IMPLANT
TUBING SMART ABLATE COOLFLOW (TUBING) ×4 IMPLANT

## 2021-03-24 NOTE — Interval H&P Note (Signed)
History and Physical Interval Note:  03/24/2021 7:43 AM  Eddie Thompson  has presented today for surgery, with the diagnosis of afib.  The various methods of treatment have been discussed with the patient and family. After consideration of risks, benefits and other options for treatment, the patient has consented to  Procedure(s): ATRIAL FIBRILLATION ABLATION (N/A) as a surgical intervention.  The patient's history has been reviewed, patient examined, no change in status, stable for surgery.  I have reviewed the patient's chart and labs.  Questions were answered to the patient's satisfaction.     Lakechia Nay [employer]

## 2021-03-24 NOTE — Progress Notes (Signed)
Threw up small amount of clear emesis. Pharmacy called again for phenergan IVPB

## 2021-03-24 NOTE — Progress Notes (Addendum)
Patient still nauseated and feeling hot. Dr. Curt Bears made aware. Pharmacy called to send phenergan.

## 2021-03-24 NOTE — Progress Notes (Signed)
Patient states nausea is a little less. Resting quietly.

## 2021-03-24 NOTE — Transfer of Care (Signed)
Immediate Anesthesia Transfer of Care Note  Patient: Eddie Thompson  Procedure(s) Performed: ATRIAL FIBRILLATION ABLATION  Patient Location: Cath Lab  Anesthesia Type:General  Level of Consciousness: awake, alert  and oriented  Airway & Oxygen Therapy: Patient Spontanous Breathing and Patient connected to nasal cannula oxygen  Post-op Assessment: Report given to RN and Post -op Vital signs reviewed and stable  Post vital signs: Reviewed and stable  Last Vitals:  Vitals Value Taken Time  BP 104/60 03/24/21 1049  Temp 35.9 C 03/24/21 1042  Pulse 64 03/24/21 1050  Resp 16 03/24/21 1050  SpO2 98 % 03/24/21 1050  Vitals shown include unvalidated device data.  Last Pain:  Vitals:   03/24/21 1042  TempSrc: Temporal  PainSc: Asleep         Complications: There were no known notable events for this encounter.

## 2021-03-24 NOTE — Progress Notes (Addendum)
Nauseated; c/o feeling hot; medicated. HR and BP stable.

## 2021-03-24 NOTE — Discharge Instructions (Addendum)
Post procedure care instructions No driving for 4 days. No lifting over 5 lbs for 1 week. No vigorous or sexual activity for 1 week. You may return to work/your usual activities on 04/01/21. Keep procedure site clean & dry. If you notice increased pain, swelling, bleeding or pus, call/return!  You may shower after 24 hours, but no soaking in baths/hot tubs/pools for 1 week.     You have an appointment set up with the [facility].  Multiple studies have shown that being followed by a dedicated atrial fibrillation clinic in addition to the standard care you receive from your other physicians improves health. We believe that enrollment in the atrial fibrillation clinic will allow [place] to better care for you.   The phone number to the [facility] is [phone]. The clinic is staffed Monday through Friday from 8:30am to 5pm.  Parking Directions: The clinic is located in the Heart and Vascular Building connected to [facility]. 1)From [address] turn on to [address] and go to the 3rd entrance  (Heart and Vascular entrance) on the right. 2)Look to the right for Heart &Vascular Parking Garage. 3)A code for the entrance is required, for October is 3333.   4)Take the elevators to the 1st floor. Registration is in the room with the glass walls at the end of the hallway.  If you have any trouble parking or locating the clinic, please don't hesitate to call [phone].   Cardiac Ablation, Care After  This sheet gives you information about how to care for yourself after your procedure. Your health care provider may also give you more specific instructions. If you have problems or questions, contact your health care provider. What can I expect after the procedure? After the procedure, it is common to have: Bruising around your puncture site. Tenderness around your puncture site. Skipped heartbeats. Tiredness (fatigue).  Follow these instructions at  home: Puncture site care  Follow instructions from your health care provider about how to take care of your puncture site. Make sure you: If present, leave stitches (sutures), skin glue, or adhesive strips in place. These skin closures may need to stay in place for up to 2 weeks. If adhesive strip edges start to loosen and curl up, you may trim the loose edges. Do not remove adhesive strips completely unless your health care provider tells you to do that. If a large square bandage is present, this may be removed 24 hours after surgery.  Check your puncture site every day for signs of infection. Check for: Redness, swelling, or pain. Fluid or blood. If your puncture site starts to bleed, lie down on your back, apply firm pressure to the area, and contact your health care provider. Warmth. Pus or a bad smell. Driving Do not drive for at least 4 days after your procedure or however long your health care provider recommends. (Do not resume driving if you have previously been instructed not to drive for other health reasons.) Do not drive or use heavy machinery while taking prescription pain medicine. Activity Avoid activities that take a lot of effort for at least 7 days after your procedure. Do not lift anything that is heavier than 5 lb (4.5 kg) for one week.  No sexual activity for 1 week.  Return to your normal activities as told by your health care provider. Ask your health care provider what activities are safe for you. General instructions Take over-the-counter and prescription medicines only as told by your health  care provider. Do not use any products that contain nicotine or tobacco, such as cigarettes and e-cigarettes. If you need help quitting, ask your health care provider. You may shower after 24 hours, but Do not take baths, swim, or use a hot tub for 1 week.  Do not drink alcohol for 24 hours after your procedure. Keep all follow-up visits as told by your health care provider. This  is important. Contact a health care provider if: You have redness, mild swelling, or pain around your puncture site. You have fluid or blood coming from your puncture site that stops after applying firm pressure to the area. Your puncture site feels warm to the touch. You have pus or a bad smell coming from your puncture site. You have a fever. You have chest pain or discomfort that spreads to your neck, jaw, or arm. You are sweating a lot. You feel nauseous. You have a fast or irregular heartbeat. You have shortness of breath. You are dizzy or light-headed and feel the need to lie down. You have pain or numbness in the arm or leg closest to your puncture site. Get help right away if: Your puncture site suddenly swells. Your puncture site is bleeding and the bleeding does not stop after applying firm pressure to the area. These symptoms may represent a serious problem that is an emergency. Do not wait to see if the symptoms will go away. Get medical help right away. Call your local emergency services (911 in the [place] Do not drive yourself to the hospital. Summary After the procedure, it is normal to have bruising and tenderness at the puncture site in your groin, neck, or forearm. Check your puncture site every day for signs of infection. Get help right away if your puncture site is bleeding and the bleeding does not stop after applying firm pressure to the area. This is a medical emergency. This information is not intended to replace advice given to you by your health care provider. Make sure you discuss any questions you have with your health care provider.

## 2021-03-24 NOTE — Progress Notes (Signed)
Pt states his nausea is gone prior to DC. Able to tolerate a [place] sandwich with no problems. Eliquis given prior to DC. Pt ambulated without difficulty or bleeding.  Discharged home with his friend, Eddie Thompson, who will drive and stay with pt x 24 hrs.

## 2021-03-24 NOTE — Anesthesia Procedure Notes (Signed)
Procedure Name: Intubation Date/Time: 03/24/2021 8:40 AM Performed by: Inda Coke, CRNA Pre-anesthesia Checklist: Patient identified, Emergency Drugs available, Suction available and Patient being monitored Patient Re-evaluated:Patient Re-evaluated prior to induction Oxygen Delivery Method: Circle System Utilized Preoxygenation: Pre-oxygenation with 100% oxygen Induction Type: IV induction Ventilation: Mask ventilation without difficulty and Oral airway inserted - appropriate to patient size Laryngoscope Size: Mac and 4 Grade View: Grade I Tube type: Oral Tube size: 7.5 mm Number of attempts: 1 Airway Equipment and Method: Stylet and Oral airway Placement Confirmation: ETT inserted through vocal cords under direct vision, positive ETCO2 and breath sounds checked- equal and bilateral Secured at: 22 cm Tube secured with: Tape Dental Injury: Teeth and Oropharynx as per pre-operative assessment

## 2021-03-25 ENCOUNTER — Encounter (HOSPITAL_COMMUNITY): Payer: Self-pay | Admitting: Cardiology

## 2021-03-25 LAB — POCT ACTIVATED CLOTTING TIME
Activated Clotting Time: 323 seconds
Activated Clotting Time: 335 seconds
Activated Clotting Time: 376 seconds

## 2021-03-25 NOTE — Anesthesia Postprocedure Evaluation (Signed)
Anesthesia Post Note  Patient: Eddie Thompson  Procedure(s) Performed: ATRIAL FIBRILLATION ABLATION     Patient location during evaluation: PACU Anesthesia Type: General Level of consciousness: sedated and patient cooperative Pain management: pain level controlled Vital Signs Assessment: post-procedure vital signs reviewed and stable Respiratory status: spontaneous breathing Cardiovascular status: stable Anesthetic complications: no   There were no known notable events for this encounter.  Last Vitals:  Vitals:   03/24/21 1430 03/24/21 1500  BP: 115/84 121/77  Pulse: 66 75  Resp: 12 14  Temp:    SpO2: 96% 96%    Last Pain:  Vitals:   03/24/21 1320  TempSrc:   PainSc: 0-No pain                 Nolon Nations

## 2021-03-26 ENCOUNTER — Telehealth: Payer: Self-pay | Admitting: Cardiology

## 2021-03-26 NOTE — Telephone Encounter (Signed)
Patient states he had an ablation Wednesday and today has a 100.1 temperature and wheezing in his breathing. He states it is not SOB and that he can hear it himself, but does not think other people can hear it. He says he is also tired from the procedure.

## 2021-03-26 NOTE — Telephone Encounter (Signed)
Call returned to Pt.  Per Pt  he has been walking around and deep breathing and coughing as advised.  States he was able to clear some mucus and his fever broke.  He feels much better.  Advised Pt if he has any further issues to call our main number and he could talk to the on call physicians for heartcare.  Pt thanked nurse for call back.

## 2021-03-26 NOTE — Telephone Encounter (Signed)
Returned call to Pt.  Per Pt he has developed a low grade fever and can feel some wheezing in his lungs when he takes a deep breath.  Advised Pt to take a few large breaths every hour and cough.  Advised he could take tylenol if needed, but advised if he coughed every hour today that should clear his lungs and his temp should return to normal.  He states he had some bleeding from his cath entry site and had left the bandage on.  Advised to remove this bandage.  Advised if any bleeding he could put a bandaid back on, but better to be open to air and also so he can assess for s/s of infection.  Pt understands.  Will check back in with Pt later today to ensure fever has resolved.

## 2021-03-29 ENCOUNTER — Other Ambulatory Visit: Payer: Self-pay

## 2021-03-29 ENCOUNTER — Ambulatory Visit (HOSPITAL_COMMUNITY)
Admission: RE | Admit: 2021-03-29 | Discharge: 2021-03-29 | Disposition: A | Discharge status: Home / Self Care | Payer: Medicare Other | Source: Ambulatory Visit | Attending: Physician Assistant | Admitting: Physician Assistant

## 2021-03-29 VITALS — BP 156/86 | HR 66 | Ht 66.0 in | Wt 194.4 lb

## 2021-03-29 DIAGNOSIS — R079 Chest pain, unspecified: Secondary | ICD-10-CM | POA: Diagnosis not present

## 2021-03-29 DIAGNOSIS — E039 Hypothyroidism, unspecified: Secondary | ICD-10-CM | POA: Diagnosis not present

## 2021-03-29 DIAGNOSIS — E669 Obesity, unspecified: Secondary | ICD-10-CM | POA: Insufficient documentation

## 2021-03-29 DIAGNOSIS — Z6831 Body mass index (BMI) 31.0-31.9, adult: Secondary | ICD-10-CM | POA: Insufficient documentation

## 2021-03-29 DIAGNOSIS — Z79899 Other long term (current) drug therapy: Secondary | ICD-10-CM | POA: Diagnosis not present

## 2021-03-29 DIAGNOSIS — R509 Fever, unspecified: Secondary | ICD-10-CM | POA: Insufficient documentation

## 2021-03-29 DIAGNOSIS — I4819 Other persistent atrial fibrillation: Secondary | ICD-10-CM | POA: Diagnosis not present

## 2021-03-29 DIAGNOSIS — R062 Wheezing: Secondary | ICD-10-CM | POA: Insufficient documentation

## 2021-03-29 DIAGNOSIS — Z8249 Family history of ischemic heart disease and other diseases of the circulatory system: Secondary | ICD-10-CM | POA: Insufficient documentation

## 2021-03-29 DIAGNOSIS — I10 Essential (primary) hypertension: Secondary | ICD-10-CM | POA: Diagnosis not present

## 2021-03-29 DIAGNOSIS — Z79890 Hormone replacement therapy (postmenopausal): Secondary | ICD-10-CM | POA: Diagnosis not present

## 2021-03-29 DIAGNOSIS — Z7901 Long term (current) use of anticoagulants: Secondary | ICD-10-CM | POA: Diagnosis not present

## 2021-03-29 DIAGNOSIS — Z7182 Exercise counseling: Secondary | ICD-10-CM | POA: Diagnosis not present

## 2021-03-29 DIAGNOSIS — R0600 Dyspnea, unspecified: Secondary | ICD-10-CM

## 2021-03-29 DIAGNOSIS — D6869 Other thrombophilia: Secondary | ICD-10-CM | POA: Diagnosis not present

## 2021-03-29 DIAGNOSIS — Z888 Allergy status to other drugs, medicaments and biological substances status: Secondary | ICD-10-CM | POA: Insufficient documentation

## 2021-03-29 DIAGNOSIS — E782 Mixed hyperlipidemia: Secondary | ICD-10-CM | POA: Diagnosis not present

## 2021-03-29 DIAGNOSIS — R0602 Shortness of breath: Secondary | ICD-10-CM | POA: Diagnosis not present

## 2021-03-29 LAB — CBC
HCT: 43 % (ref 39.0–52.0)
Hemoglobin: 15.1 g/dL (ref 13.0–17.0)
MCH: 34.5 pg — ABNORMAL HIGH (ref 26.0–34.0)
MCHC: 35.1 g/dL (ref 30.0–36.0)
MCV: 98.2 fL (ref 80.0–100.0)
Platelets: 161 10*3/uL (ref 150–400)
RBC: 4.38 MIL/uL (ref 4.22–5.81)
RDW: 12.8 % (ref 11.5–15.5)
WBC: 5.4 10*3/uL (ref 4.0–10.5)
nRBC: 0 % (ref 0.0–0.2)

## 2021-03-29 LAB — BASIC METABOLIC PANEL
Anion gap: 8 (ref 5–15)
BUN: 17 mg/dL (ref 8–23)
CO2: 31 mmol/L (ref 22–32)
Calcium: 9.3 mg/dL (ref 8.9–10.3)
Chloride: 102 mmol/L (ref 98–111)
Creatinine, Ser: 1.2 mg/dL (ref 0.61–1.24)
GFR, Estimated: >60 mL/min (ref >60)
Glucose, Bld: 130 mg/dL — ABNORMAL HIGH (ref 70–99)
Potassium: 4.7 mmol/L (ref 3.5–5.1)
Sodium: 141 mmol/L (ref 135–145)

## 2021-03-29 LAB — BRAIN NATRIURETIC PEPTIDE: B Natriuretic Peptide: 195.2 pg/mL — ABNORMAL HIGH (ref 0.0–100.0)

## 2021-03-29 MED ORDER — FUROSEMIDE 20 MG PO TABS: ORAL_TABLET | ORAL | 0 refills | Status: DC

## 2021-03-29 NOTE — Progress Notes (Signed)
Primary Care Physician: [name], MD Primary Cardiologist: Dr Harrington Challenger Primary Electrophysiologist: Dr Curt Bears Referring Physician: Dr Lerry Liner is a 70 y.o. male with a history of HTN, hypothyroidism, HLD, atrial flutter, atrial fibrillation who presents for follow up in the [facility]. Patient is on Eliquis for a CHADS2VASC score of 2. Patient underwent afib ablation with Dr Curt Bears on 03/24/21. Patient noted a low grade fever and some "wheezing" on 03/26/21. He was instructed to take tylenol and follow up in a few days. Patient sent a message reporting that his fever was higher and he had to prop up on pillows to sleep over the weekend. He does state that he feels better today. He has not had a fever today. He denies CP, swallowing pain, or groin issues. He is in SR.  Today, he denies symptoms of palpitations, chest pain, PND, lower extremity edema, dizziness, presyncope, syncope, snoring, daytime somnolence, bleeding, or neurologic sequela. The patient is tolerating medications without difficulties and is otherwise without complaint today.    Atrial Fibrillation Risk Factors:  he does not have symptoms or diagnosis of sleep apnea. he does not have a history of rheumatic fever.   he has a BMI of Body mass index is 31.38 kg/m.Marland Kitchen Filed Weights   03/29/21 1432  Weight: 88.2 kg    Family History  Problem Relation Age of Onset   Heart attack Father      Atrial Fibrillation Management history:  Previous antiarrhythmic drugs: none Previous cardioversions: 11/15/19, 07/24/20 Previous ablations: 03/24/21 CHADS2VASC score: 2 Anticoagulation history: Eliquis   Past Medical History:  Diagnosis Date   Actinic keratosis    Atrial flutter (HCC)    Gout    HTN (hypertension)    Hypothyroidism    Mixed hyperlipidemia    PONV (postoperative nausea and vomiting)    Past Surgical History:  Procedure Laterality Date   ATRIAL  FIBRILLATION ABLATION N/A 03/24/2021   Procedure: ATRIAL FIBRILLATION ABLATION;  Surgeon: Constance Haw, MD;  Location: [facility];  Service: Cardiovascular;  Laterality: N/A;   CARDIOVERSION N/A 11/15/2019   Procedure: CARDIOVERSION;  Surgeon: Buford Dresser, MD;  Location: [facility] ENDOSCOPY;  Service: Cardiovascular;  Laterality: N/A;   CARDIOVERSION N/A 07/24/2020   Procedure: CARDIOVERSION;  Surgeon: Skeet Latch, MD;  Location: [facility] ENDOSCOPY;  Service: Cardiovascular;  Laterality: N/A;    Current Outpatient Medications  Medication Sig Dispense Refill   acetaminophen (TYLENOL) 325 MG tablet Take 325-650 mg by mouth every 6 (six) hours as needed for headache, moderate pain or mild pain.     allopurinol (ZYLOPRIM) 100 MG tablet Take 100 mg by mouth daily.     alum hydroxide-mag trisilicate (GAVISCON) 83-66 MG CHEW chewable tablet Chew 2 tablets by mouth daily as needed for indigestion or heartburn.     atorvastatin (LIPITOR) 40 MG tablet Take 40 mg by mouth daily.     DENTA 5000 PLUS 1.1 % CREA dental cream Take by mouth daily. Uses Tooth paste     ELIQUIS 5 MG TABS tablet TAKE 1 TABLET BY MOUTH TWICE A DAY 60 tablet 5   famotidine (PEPCID) 20 MG tablet Take 20 mg by mouth daily.     levothyroxine (SYNTHROID) 100 MCG tablet Take 100 mcg by mouth daily before breakfast.     losartan-hydrochlorothiazide (HYZAAR) 100-12.5 MG tablet Take 1 tablet by mouth daily.     Metoprolol Succinate 100 MG CS24 Take 100 mg by mouth  daily.      Multiple Vitamin (MULTIVITAMIN) capsule Take 1 capsule by mouth daily.     Omega-3 Fatty Acids (FISH OIL) 1000 MG CAPS Take 1,000-2,000 mg by mouth See admin instructions. Alternate taking 1000 mg one day then 2000 mg the next     pyridOXINE (VITAMIN B-6) 100 MG tablet Take 100 mg by mouth daily.     No current facility-administered medications for this encounter.    Allergies  Allergen Reactions   Dolobid [Diflunisal] Nausea And Vomiting and  Rash   Other     ADVICOR- passes out   Tetracyclines & Related Swelling and Rash    Social History   Socioeconomic History   Marital status: Divorced    Spouse name: Not on file   Number of children: Not on file   Years of education: Not on file   Highest education level: Not on file  Occupational History   Not on file  Tobacco Use   Smoking status: Never   Smokeless tobacco: Never  Substance and Sexual Activity   Alcohol use: Never   Drug use: Never   Sexual activity: Not on file  Other Topics Concern   Not on file  Social History Narrative   Not on file   Social Determinants of Health   Financial Resource Strain: Not on file  Food Insecurity: Not on file  Transportation Needs: Not on file  Physical Activity: Not on file  Stress: Not on file  Social Connections: Not on file  Intimate Partner Violence: Not on file     ROS- All systems are reviewed and negative except as per the HPI above.  Physical Exam: Vitals:   03/29/21 1432  BP: (!) 156/86  Pulse: 66  Weight: 88.2 kg  Height: 5\' 6"  (1.676 m)    GEN- The patient is a well appearing obese male, alert and oriented x 3 today.   Head- normocephalic, atraumatic Eyes-  Sclera clear, conjunctiva pink Ears- hearing intact Oropharynx- clear Neck- supple  Lungs- Clear to ausculation bilaterally, normal work of breathing Heart- Regular rate and rhythm, no murmurs, rubs or gallops  GI- soft, NT, ND, + BS Extremities- no clubbing, cyanosis, or edema MS- no significant deformity or atrophy Skin- no rash or lesion Psych- euthymic mood, full affect Neuro- strength and sensation are intact  Wt Readings from Last 3 Encounters:  03/29/21 88.2 kg  03/24/21 87.1 kg  02/25/21 88 kg    EKG today demonstrates  SR, NST Vent. rate 66 BPM PR interval 188 ms QRS duration 82 ms QT/QTcB 424/444 ms  Echo 10/03/19 demonstrated  1. Left ventricular ejection fraction, by estimation, is 60 to 65%. The  left ventricle  has normal function. The left ventricle has no regional  wall motion abnormalities. There is mild left ventricular hypertrophy.  Left ventricular diastolic parameters are indeterminate.   2. Right ventricular systolic function is mildly reduced. The right  ventricular size is mildly enlarged. Tricuspid regurgitation signal is  inadequate for assessing PA pressure.   3. Left atrial size was mild to moderately dilated.   4. Right atrial size was mildly dilated.   5. The mitral valve is normal in structure. Mild to moderate mitral valve regurgitation. No evidence of mitral stenosis.   6. The aortic valve is tricuspid. Aortic valve regurgitation is not  visualized. No aortic stenosis is present.   7. Aortic dilatation noted. There is mild dilatation of the ascending  aorta measuring 39 mm.   8. The  inferior vena cava is normal in size with greater than 50%  respiratory variability, suggesting right atrial pressure of 3 mmHg.   9. The patient was in atrial fibrillation.  Epic records are reviewed at length today  CHA2DS2-VASc Score = 2  The patient's score is based upon: CHF History: 0 HTN History: 1 Diabetes History: 0 Stroke History: 0 Vascular Disease History: 0 Age Score: 1 Gender Score: 0      ASSESSMENT AND PLAN: 1. Persistent Atrial Fibrillation (ICD10:  I48.19) The patient's CHA2DS2-VASc score is 2, indicating a 2.2% annual risk of stroke.   S/p afib ablation with Dr Curt Bears on 03/24/21 Patient appears to be maintaining SR.  Unclear etiology of fever.  Check cbc/bmet/BNP and CXR Continue Eliquis 5 mg BID Continue Toprol 100 mg daily  2. Secondary Hypercoagulable State (ICD10:  D68.69) The patient is at significant risk for stroke/thromboembolism based upon his CHA2DS2-VASc Score of 2.  Continue Apixaban (Eliquis).   3. Obesity Body mass index is 31.38 kg/m. Lifestyle modification was discussed at length including regular exercise and weight reduction.  4.  HTN Stable, no changes today.  5. Fever See plans above.    Follow up in the AF clinic as scheduled.    [facility] [address] [place], [place] [address] [phone] 03/29/2021 4:47 PM

## 2021-04-02 ENCOUNTER — Telehealth (HOSPITAL_COMMUNITY): Payer: Self-pay | Admitting: Physician Assistant

## 2021-04-02 NOTE — Telephone Encounter (Signed)
Patient called stating after taking furosemide since 03/30/21 he is feeling better.  Patient states that he still has a non-productive cough but no fever and wanted to know if he needed to continue furosemide.  Spoke with Adline Peals, PA-C who advised pt does not need to continue furosemide at this time.  Patient was advised and voiced understanding.

## 2021-04-14 DIAGNOSIS — Z23 Encounter for immunization: Secondary | ICD-10-CM | POA: Diagnosis not present

## 2021-04-20 ENCOUNTER — Other Ambulatory Visit (HOSPITAL_COMMUNITY): Payer: Self-pay | Admitting: Physician Assistant

## 2021-04-21 ENCOUNTER — Ambulatory Visit (HOSPITAL_COMMUNITY): Payer: Medicare Other | Admitting: Physician Assistant

## 2021-04-21 ENCOUNTER — Encounter (HOSPITAL_COMMUNITY): Payer: Self-pay

## 2021-04-22 DIAGNOSIS — D6869 Other thrombophilia: Secondary | ICD-10-CM | POA: Diagnosis not present

## 2021-04-22 DIAGNOSIS — I10 Essential (primary) hypertension: Secondary | ICD-10-CM | POA: Diagnosis not present

## 2021-04-22 DIAGNOSIS — E782 Mixed hyperlipidemia: Secondary | ICD-10-CM | POA: Diagnosis not present

## 2021-04-22 DIAGNOSIS — U071 COVID-19: Secondary | ICD-10-CM | POA: Diagnosis not present

## 2021-04-22 DIAGNOSIS — I4892 Unspecified atrial flutter: Secondary | ICD-10-CM | POA: Diagnosis not present

## 2021-04-28 NOTE — Telephone Encounter (Signed)
Followed up with pt who reports doing much better, congestion improved/gone. PCP started him on antiviral and doing much better. He will discuss booster timing with his PCP. He appreciates my follow up .

## 2021-04-29 ENCOUNTER — Encounter (HOSPITAL_COMMUNITY): Payer: Self-pay | Admitting: Physician Assistant

## 2021-04-29 ENCOUNTER — Ambulatory Visit (HOSPITAL_COMMUNITY)
Admission: RE | Admit: 2021-04-29 | Discharge: 2021-04-29 | Disposition: A | Discharge status: Home / Self Care | Payer: Medicare Other | Source: Ambulatory Visit | Attending: Physician Assistant | Admitting: Physician Assistant

## 2021-04-29 ENCOUNTER — Other Ambulatory Visit: Payer: Self-pay

## 2021-04-29 VITALS — BP 150/90 | HR 54 | Ht 66.0 in | Wt 188.8 lb

## 2021-04-29 DIAGNOSIS — E669 Obesity, unspecified: Secondary | ICD-10-CM | POA: Insufficient documentation

## 2021-04-29 DIAGNOSIS — I4819 Other persistent atrial fibrillation: Secondary | ICD-10-CM

## 2021-04-29 DIAGNOSIS — I10 Essential (primary) hypertension: Secondary | ICD-10-CM | POA: Insufficient documentation

## 2021-04-29 DIAGNOSIS — Z6830 Body mass index (BMI) 30.0-30.9, adult: Secondary | ICD-10-CM | POA: Insufficient documentation

## 2021-04-29 DIAGNOSIS — Z8616 Personal history of COVID-19: Secondary | ICD-10-CM | POA: Diagnosis not present

## 2021-04-29 DIAGNOSIS — Z8249 Family history of ischemic heart disease and other diseases of the circulatory system: Secondary | ICD-10-CM | POA: Insufficient documentation

## 2021-04-29 DIAGNOSIS — Z7901 Long term (current) use of anticoagulants: Secondary | ICD-10-CM | POA: Insufficient documentation

## 2021-04-29 DIAGNOSIS — D6869 Other thrombophilia: Secondary | ICD-10-CM

## 2021-04-29 NOTE — Progress Notes (Signed)
Primary Care Physician: [name], MD Primary Cardiologist: Dr Harrington Challenger Primary Electrophysiologist: Dr Curt Bears Referring Physician: Dr Lerry Liner is a 70 y.o. male with a history of HTN, hypothyroidism, HLD, atrial flutter, atrial fibrillation who presents for follow up in the [facility]. Patient is on Eliquis for a CHADS2VASC score of 2. Patient underwent afib ablation with Dr Curt Bears on 03/24/21. Patient noted a low grade fever and some "wheezing" on 03/26/21. He was instructed to take tylenol and follow up in a few days. Patient sent a message reporting that his fever was higher and he had to prop up on pillows to sleep over the weekend. He was started on Lasix which improved his breathing but his fever and cough continued. He was diagnosed with COVID and treated by his PCP.  On follow up today, patient reports he is doing "much better" since his last visit. He has not had any afib symptoms and his cough/SOB is improving. No CP or swallowing pain.   Today, he denies symptoms of palpitations, chest pain, PND, lower extremity edema, dizziness, presyncope, syncope, snoring, daytime somnolence, bleeding, or neurologic sequela. The patient is tolerating medications without difficulties and is otherwise without complaint today.    Atrial Fibrillation Risk Factors:  he does not have symptoms or diagnosis of sleep apnea. he does not have a history of rheumatic fever.   he has a BMI of Body mass index is 30.47 kg/m.Marland Kitchen Filed Weights   04/29/21 0856  Weight: 85.6 kg     Family History  Problem Relation Age of Onset   Heart attack Father      Atrial Fibrillation Management history:  Previous antiarrhythmic drugs: none Previous cardioversions: 11/15/19, 07/24/20 Previous ablations: 03/24/21 CHADS2VASC score: 2 Anticoagulation history: Eliquis   Past Medical History:  Diagnosis Date   Actinic keratosis    Atrial flutter (HCC)     Gout    HTN (hypertension)    Hypothyroidism    Mixed hyperlipidemia    PONV (postoperative nausea and vomiting)    Past Surgical History:  Procedure Laterality Date   ATRIAL FIBRILLATION ABLATION N/A 03/24/2021   Procedure: ATRIAL FIBRILLATION ABLATION;  Surgeon: Constance Haw, MD;  Location: [facility];  Service: Cardiovascular;  Laterality: N/A;   CARDIOVERSION N/A 11/15/2019   Procedure: CARDIOVERSION;  Surgeon: Buford Dresser, MD;  Location: [facility] ENDOSCOPY;  Service: Cardiovascular;  Laterality: N/A;   CARDIOVERSION N/A 07/24/2020   Procedure: CARDIOVERSION;  Surgeon: Skeet Latch, MD;  Location: [facility] ENDOSCOPY;  Service: Cardiovascular;  Laterality: N/A;    Current Outpatient Medications  Medication Sig Dispense Refill   acetaminophen (TYLENOL) 325 MG tablet Take 325-650 mg by mouth every 6 (six) hours as needed for headache, moderate pain or mild pain.     allopurinol (ZYLOPRIM) 100 MG tablet Take 100 mg by mouth daily.     alum hydroxide-mag trisilicate (GAVISCON) 21-30 MG CHEW chewable tablet Chew 2 tablets by mouth daily as needed for indigestion or heartburn.     atorvastatin (LIPITOR) 40 MG tablet Take 40 mg by mouth daily.     DENTA 5000 PLUS 1.1 % CREA dental cream Take by mouth daily. Uses Tooth paste     ELIQUIS 5 MG TABS tablet TAKE 1 TABLET BY MOUTH TWICE A DAY 60 tablet 5   famotidine (PEPCID) 20 MG tablet Take 20 mg by mouth daily.     levothyroxine (SYNTHROID) 100 MCG tablet Take 100 mcg by  mouth daily before breakfast.     losartan-hydrochlorothiazide (HYZAAR) 100-12.5 MG tablet Take 1 tablet by mouth daily.     Metoprolol Succinate 100 MG CS24 Take 100 mg by mouth daily.      Multiple Vitamin (MULTIVITAMIN) capsule Take 1 capsule by mouth daily.     Omega-3 Fatty Acids (FISH OIL) 1000 MG CAPS Take 1,000-2,000 mg by mouth See admin instructions. Alternate taking 1000 mg one day then 2000 mg the next     pyridOXINE (VITAMIN B-6) 100 MG tablet  Take 100 mg by mouth daily.     No current facility-administered medications for this encounter.    Allergies  Allergen Reactions   Dolobid [Diflunisal] Nausea And Vomiting and Rash   Other     ADVICOR- passes out   Tetracyclines & Related Swelling and Rash    Social History   Socioeconomic History   Marital status: Divorced    Spouse name: Not on file   Number of children: Not on file   Years of education: Not on file   Highest education level: Not on file  Occupational History   Not on file  Tobacco Use   Smoking status: Never   Smokeless tobacco: Never  Substance and Sexual Activity   Alcohol use: Never   Drug use: Never   Sexual activity: Not on file  Other Topics Concern   Not on file  Social History Narrative   Not on file   Social Determinants of Health   Financial Resource Strain: Not on file  Food Insecurity: Not on file  Transportation Needs: Not on file  Physical Activity: Not on file  Stress: Not on file  Social Connections: Not on file  Intimate Partner Violence: Not on file     ROS- All systems are reviewed and negative except as per the HPI above.  Physical Exam: Vitals:   04/29/21 0856  BP: (!) 150/90  Pulse: (!) 54  Weight: 85.6 kg  Height: 5\' 6"  (1.676 m)    GEN- The patient is a well appearing obese male, alert and oriented x 3 today.   HEENT-head normocephalic, atraumatic, sclera clear, conjunctiva pink, hearing intact, trachea midline. Lungs- Clear to ausculation bilaterally, normal work of breathing Heart- Regular rate and rhythm, bradycardia, no murmurs, rubs or gallops  GI- soft, NT, ND, + BS Extremities- no clubbing, cyanosis, or edema MS- no significant deformity or atrophy Skin- no rash or lesion Psych- euthymic mood, full affect Neuro- strength and sensation are intact   Wt Readings from Last 3 Encounters:  04/29/21 85.6 kg  03/29/21 88.2 kg  03/24/21 87.1 kg    EKG today demonstrates  SB Vent. rate 54 BPM PR  interval 200 ms QRS duration 82 ms QT/QTcB 440/417 ms  Echo 10/03/19 demonstrated  1. Left ventricular ejection fraction, by estimation, is 60 to 65%. The  left ventricle has normal function. The left ventricle has no regional  wall motion abnormalities. There is mild left ventricular hypertrophy.  Left ventricular diastolic parameters are indeterminate.   2. Right ventricular systolic function is mildly reduced. The right  ventricular size is mildly enlarged. Tricuspid regurgitation signal is  inadequate for assessing PA pressure.   3. Left atrial size was mild to moderately dilated.   4. Right atrial size was mildly dilated.   5. The mitral valve is normal in structure. Mild to moderate mitral valve regurgitation. No evidence of mitral stenosis.   6. The aortic valve is tricuspid. Aortic valve regurgitation is not  visualized. No aortic stenosis is present.   7. Aortic dilatation noted. There is mild dilatation of the ascending  aorta measuring 39 mm.   8. The inferior vena cava is normal in size with greater than 50%  respiratory variability, suggesting right atrial pressure of 3 mmHg.   9. The patient was in atrial fibrillation.  Epic records are reviewed at length today  CHA2DS2-VASc Score = 2  The patient's score is based upon: CHF History: 0 HTN History: 1 Diabetes History: 0 Stroke History: 0 Vascular Disease History: 0 Age Score: 1 Gender Score: 0      ASSESSMENT AND PLAN: 1. Persistent Atrial Fibrillation (ICD10:  I48.19) The patient's CHA2DS2-VASc score is 2, indicating a 2.2% annual risk of stroke.   S/p afib ablation with Dr Curt Bears on 03/24/21 Patient appears to be maintaining SR. Continue Eliquis 5 mg BID Continue Toprol 100 mg daily  2. Secondary Hypercoagulable State (ICD10:  D68.69) The patient is at significant risk for stroke/thromboembolism based upon his CHA2DS2-VASc Score of 2.  Continue Apixaban (Eliquis).   3. Obesity Body mass index is 30.47  kg/m. Lifestyle modification was discussed and encouraged including regular physical activity and weight reduction.  4. HTN Mildly elevated today, has been better controlled at previous visits. Continue to monitor.     Follow up with Dr Curt Bears as scheduled.    [facility] [address] [place], [place] [address] [phone] 04/29/2021 9:04 AM

## 2021-05-03 ENCOUNTER — Ambulatory Visit: Payer: Medicare Other | Admitting: Cardiology

## 2021-06-29 ENCOUNTER — Other Ambulatory Visit: Payer: Self-pay

## 2021-06-29 ENCOUNTER — Ambulatory Visit (INDEPENDENT_AMBULATORY_CARE_PROVIDER_SITE_OTHER): Payer: Medicare Other | Admitting: Cardiology

## 2021-06-29 ENCOUNTER — Encounter: Payer: Self-pay | Admitting: Cardiology

## 2021-06-29 VITALS — BP 156/92 | HR 62 | Ht 66.0 in | Wt 198.0 lb

## 2021-06-29 DIAGNOSIS — E782 Mixed hyperlipidemia: Secondary | ICD-10-CM

## 2021-06-29 DIAGNOSIS — I4819 Other persistent atrial fibrillation: Secondary | ICD-10-CM | POA: Diagnosis not present

## 2021-06-29 DIAGNOSIS — I10 Essential (primary) hypertension: Secondary | ICD-10-CM

## 2021-06-29 NOTE — Patient Instructions (Addendum)
Medication Instructions:  Your physician recommends that you continue on your current medications as directed. Please refer to the Current Medication list given to you today. *If you need a refill on your cardiac medications before your next appointment, please call your pharmacy*  Lab Work: None. If you have labs (blood work) drawn today and your tests are completely normal, you will receive your results only by: [place] (if you have MyChart) OR A paper copy in the mail If you have any lab test that is abnormal or we need to change your treatment, we will call you to review the results.  Testing/Procedures: None.  Follow-Up: At [facility], you and your health needs are our priority.  As part of our continuing mission to provide you with exceptional heart care, we have created designated Provider Care Teams.  These Care Teams include your primary Cardiologist (physician) and Advanced Practice Providers (APPs -  Physician Assistants and Nurse Practitioners) who all work together to provide you with the care you need, when you need it.  Your physician wants you to follow-up in: 09/29/21 at 9:15 am with one of the following Advanced Practice Providers on your designated Care Team:    [name], [place]  We recommend signing up for the patient portal called "MyChart".  Sign up information is provided on this After Visit Summary.  MyChart is used to connect with patients for Virtual Visits (Telemedicine).  Patients are able to view lab/test results, encounter notes, upcoming appointments, etc.  Non-urgent messages can be sent to your provider as well.   To learn more about what you can do with MyChart, go to [email or web address].    Any Other Special Instructions Will Be Listed Below (If Applicable).

## 2021-06-29 NOTE — Progress Notes (Signed)
Electrophysiology Office Note   Date:  06/29/2021   ID:  Eddie Thompson, DOB [birth year], MRN [id number]  PCP:  [name], MD  Cardiologist:  Harrington Challenger Primary Electrophysiologist:  Glanda Spanbauer [name], MD    Chief Complaint: AF   History of Present Illness: Eddie Thompson is a 71 y.o. male who is being seen today for the evaluation of AF at the request of [name], MD. Presenting today for electrophysiology evaluation.  He has a history significant for atrial fibrillation, hypertension, hyperlipidemia, hypothyroidism.  His atrial fibrillation symptoms are palpitations, fatigue, shortness of breath.  He had multiple cardioversions but unfortunately had more frequent episodes of atrial fibrillation.  He is now status post ablation 03/24/2021.  Today, denies symptoms of palpitations, chest pain, shortness of breath, orthopnea, PND, lower extremity edema, claudication, dizziness, presyncope, syncope, bleeding, or neurologic sequela. The patient is tolerating medications without difficulties.  Since his ablation he has done well.  He is noted no further episodes of atrial fibrillation.  He is able to do all of his daily activities without restriction.  He overall feels well and has no major complaint at this time.  Past Medical History:  Diagnosis Date   Actinic keratosis    Atrial flutter (HCC)    Gout    HTN (hypertension)    Hypothyroidism    Mixed hyperlipidemia    PONV (postoperative nausea and vomiting)    Past Surgical History:  Procedure Laterality Date   ATRIAL FIBRILLATION ABLATION N/A 03/24/2021   Procedure: ATRIAL FIBRILLATION ABLATION;  Surgeon: Constance Haw, MD;  Location: [facility] [place] [facility];  Service: Cardiovascular;  Laterality: N/A;   CARDIOVERSION N/A 11/15/2019   Procedure: CARDIOVERSION;  Surgeon: Buford Dresser, MD;  Location: [facility] ENDOSCOPY;  Service: Cardiovascular;  Laterality: N/A;   CARDIOVERSION N/A 07/24/2020   Procedure:  CARDIOVERSION;  Surgeon: Skeet Latch, MD;  Location: [facility] ENDOSCOPY;  Service: Cardiovascular;  Laterality: N/A;     Current Outpatient Medications  Medication Sig Dispense Refill   acetaminophen (TYLENOL) 325 MG tablet Take 325-650 mg by mouth every 6 (six) hours as needed for headache, moderate pain or mild pain.     allopurinol (ZYLOPRIM) 100 MG tablet Take 100 mg by mouth daily.     alum hydroxide-mag trisilicate (GAVISCON) 14-97 MG CHEW chewable tablet Chew 2 tablets by mouth daily as needed for indigestion or heartburn.     atorvastatin (LIPITOR) 40 MG tablet Take 40 mg by mouth daily.     DENTA 5000 PLUS 1.1 % CREA dental cream Take by mouth daily. Uses Tooth paste     ELIQUIS 5 MG TABS tablet TAKE 1 TABLET BY MOUTH TWICE A DAY 60 tablet 5   famotidine (PEPCID) 20 MG tablet Take 20 mg by mouth daily.     levothyroxine (SYNTHROID) 100 MCG tablet Take 100 mcg by mouth daily before breakfast. TAKE A EXTRA HALF MON AND FRI ONLY     losartan-hydrochlorothiazide (HYZAAR) 100-12.5 MG tablet Take 1 tablet by mouth daily.     Metoprolol Succinate 100 MG CS24 Take 100 mg by mouth daily.      Multiple Vitamin (MULTIVITAMIN) capsule Take 1 capsule by mouth daily.     Omega-3 Fatty Acids (FISH OIL) 1000 MG CAPS Take 1,000-2,000 mg by mouth See admin instructions. Alternate taking 1000 mg one day then 2000 mg the next     pyridOXINE (VITAMIN B-6) 100 MG tablet Take 100 mg by mouth daily.     No  current facility-administered medications for this visit.    Allergies:   Dolobid [diflunisal], Other, and Tetracyclines & related   Social History:  The patient  reports that he has never smoked. He has never used smokeless tobacco. He reports that he does not drink alcohol and does not use drugs.   Family History:  The patient's family history includes Heart attack in his father.   ROS:  Please see the history of present illness.   Otherwise, review of systems is positive for none.   All other  systems are reviewed and negative.   PHYSICAL EXAM: VS:  BP (!) 156/92    Pulse 62    Ht 5\' 6"  (1.676 m)    Wt 198 lb (89.8 kg)    BMI 31.96 kg/m  , BMI Body mass index is 31.96 kg/m. GEN: Well nourished, well developed, in no acute distress  HEENT: normal  Neck: no JVD, carotid bruits, or masses Cardiac: RRR; no murmurs, rubs, or gallops,no edema  Respiratory:  clear to auscultation bilaterally, normal work of breathing GI: soft, nontender, nondistended, + BS MS: no deformity or atrophy  Skin: warm and dry Neuro:  Strength and sensation are intact Psych: euthymic mood, full affect  EKG:  EKG is ordered today. Personal review of the ekg ordered shows sinus rhythm, rate 62  Recent Labs: 03/29/2021: B Natriuretic Peptide 195.2; BUN 17; Creatinine, Ser 1.20; Hemoglobin 15.1; Platelets 161; Potassium 4.7; Sodium 141    Lipid Panel  No results found for: CHOL, TRIG, HDL, CHOLHDL, VLDL, LDLCALC, LDLDIRECT   Wt Readings from Last 3 Encounters:  06/29/21 198 lb (89.8 kg)  04/29/21 188 lb 12.8 oz (85.6 kg)  03/29/21 194 lb 6.4 oz (88.2 kg)      Other studies Reviewed: Additional studies/ records that were reviewed today include: TTE 10/03/19  Review of the above records today demonstrates:   1. Left ventricular ejection fraction, by estimation, is 60 to 65%. The  left ventricle has normal function. The left ventricle has no regional  wall motion abnormalities. There is mild left ventricular hypertrophy.  Left ventricular diastolic parameters  are indeterminate.   2. Right ventricular systolic function is mildly reduced. The right  ventricular size is mildly enlarged. Tricuspid regurgitation signal is  inadequate for assessing PA pressure.   3. Left atrial size was mild to moderately dilated.   4. Right atrial size was mildly dilated.   5. The mitral valve is normal in structure. Mild to moderate mitral valve  regurgitation. No evidence of mitral stenosis.   6. The aortic valve  is tricuspid. Aortic valve regurgitation is not  visualized. No aortic stenosis is present.   7. Aortic dilatation noted. There is mild dilatation of the ascending  aorta measuring 39 mm.   8. The inferior vena cava is normal in size with greater than 50%  respiratory variability, suggesting right atrial pressure of 3 mmHg.   9. The patient was in atrial fibrillation.    ASSESSMENT AND PLAN:  1.  Persistent atrial fibrillation: Currently on Eliquis and metoprolol.  CHA2DS2-VASc of 2.  Is status post ablation 03/24/2021.  He is fortunately remained in sinus rhythm.  He has had no further episodes of atrial fibrillation.  He is overall feeling well and is without complaint.  2.  Hypertension: Elevated today.  Usually well controlled at home.  Plan per primary physician.  3.  Hyperlipidemia: Continue atorvastatin 40 mg.  Current medicines are reviewed at length with the patient today.  The patient does not have concerns regarding his medicines.  The following changes were made today: None  Labs/ tests ordered today include:  Orders Placed This Encounter  Procedures   EKG 12-Lead      Disposition:   FU with EP app 3 months  Signed, Michala Deblanc [name], MD  06/29/2021 10:07 AM     [address] [place] [address] [phone] (office) [phone] (fax)

## 2021-08-05 DIAGNOSIS — E039 Hypothyroidism, unspecified: Secondary | ICD-10-CM | POA: Diagnosis not present

## 2021-08-29 ENCOUNTER — Other Ambulatory Visit: Payer: Self-pay | Admitting: Cardiology

## 2021-08-29 DIAGNOSIS — I4891 Unspecified atrial fibrillation: Secondary | ICD-10-CM

## 2021-08-30 NOTE — Telephone Encounter (Signed)
Eliquis '5mg'$  refill request received. Patient is 71 years old, weight-89.8kg, Crea-1.20 on 03/29/2021, Diagnosis-Afib, and last seen by Dr. Curt Bears on 06/29/2021. Dose is appropriate based on dosing criteria. Will send in refill to requested pharmacy.   ?

## 2021-09-27 NOTE — Progress Notes (Signed)
? ?Cardiology Office Note ?Date:  09/29/2021  ?Patient ID:  Eddie Thompson, Eddie Thompson [birth year], MRN [id number] ?PCP:  [name], MD  ?Electrophysiologist: Dr. Curt Bears ? ? ?  ?Chief Complaint:  3 mo ? ?History of Present Illness: ?Eddie Thompson is a 71 y.o. male with history of HTN, HLD, Hypothyroidism, Afib ? ?He come sin today to be seen for Dr. Curt Bears, last seen by him Jan 2023, he was doing well, did not think he had any AFib post ablation.  BP was up, though better at home. ?No changes were made. ? ?TODAY ?He is doing very well. ?His symptom with AFib was unusually SOB.  He does not think he has had any AF since his ablation ?Stays busy, watches 4 grand kids, always on the go, no formal exercise. 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 ?No CP, palpitations, SOB ?No near syncope or syncope. ?No bleeding ? ? ?Afib Hx ?Diagnosed feb 2021 ?PVI ablation 03/24/21 ? ? ?Past Medical History:  ?Diagnosis Date  ? Actinic keratosis   ? Atrial flutter ([place])   ? Gout   ? HTN (hypertension)   ? Hypothyroidism   ? Mixed hyperlipidemia   ? PONV (postoperative nausea and vomiting)   ? ? ?Past Surgical History:  ?Procedure Laterality Date  ? ATRIAL FIBRILLATION ABLATION N/A 03/24/2021  ? Procedure: ATRIAL FIBRILLATION ABLATION;  Surgeon: Constance Haw, MD;  Location: [place] [facility];  Service: Cardiovascular;  Laterality: N/A;  ? CARDIOVERSION N/A 11/15/2019  ? Procedure: CARDIOVERSION;  Surgeon: Buford Dresser, MD;  Location: [place];  Service: Cardiovascular;  Laterality: N/A;  ? CARDIOVERSION N/A 07/24/2020  ? Procedure: CARDIOVERSION;  Surgeon: Skeet Latch, MD;  Location: [place];  Service: Cardiovascular;  Laterality: N/A;  ? ? ?Current Outpatient Medications  ?Medication Sig Dispense Refill  ? acetaminophen (TYLENOL) 325 MG tablet Take 325-650 mg by mouth every 6 (six) hours as needed for headache, moderate pain or mild  pain.    ? allopurinol (ZYLOPRIM) 100 MG tablet Take 100 mg by mouth daily.    ? alum hydroxide-mag trisilicate (GAVISCON) 62-83 MG CHEW chewable tablet Chew 2 tablets by mouth daily as needed for indigestion or heartburn.    ? atorvastatin (LIPITOR) 40 MG tablet Take 40 mg by mouth daily.    ? DENTA 5000 PLUS 1.1 % CREA dental cream Take by mouth daily. Uses Tooth paste    ? ELIQUIS 5 MG TABS tablet TAKE 1 TABLET BY MOUTH TWICE A DAY 60 tablet 5  ? famotidine (PEPCID) 20 MG tablet Take 20 mg by mouth daily.    ? levothyroxine (SYNTHROID) 100 MCG tablet Take 100 mcg by mouth daily before breakfast. TAKE A EXTRA HALF MON  ONLY    ? losartan-hydrochlorothiazide (HYZAAR) 100-12.5 MG tablet Take 1 tablet by mouth daily.    ? Metoprolol Succinate 100 MG CS24 Take 100 mg by mouth daily.     ? Multiple Vitamin (MULTIVITAMIN) capsule Take 1 capsule by mouth daily.    ? Omega-3 Fatty Acids (FISH OIL) 1000 MG CAPS Take 1,000-2,000 mg by mouth See admin instructions. Alternate taking 1000 mg one day then 2000 mg the next    ? pyridOXINE (VITAMIN B-6) 100 MG tablet Take 100 mg by mouth daily.    ? ?No current facility-administered medications for this visit.  ? ? ?Allergies:   Dolobid [diflunisal], Other, and Tetracyclines & related  ? ?Social  History:  The patient  reports that he has never smoked. He has never used smokeless tobacco. He reports that he does not drink alcohol and does not use drugs.  ? ?Family History:  The patient's family history includes Heart attack in his father. ? ?ROS:  Please see the history of present illness.    ?All other systems are reviewed and otherwise negative.  ? ?PHYSICAL EXAM:  ?VS:  BP (!) 146/78   Pulse (!) 55   Ht '5\' 6"'$  (1.676 m)   Wt 192 lb (87.1 kg)   SpO2 97%   BMI 30.99 kg/m?  BMI: Body mass index is 30.99 kg/m?. ?Well nourished, well developed, in no acute distress ?HEENT: normocephalic, atraumatic ?Neck: no JVD, carotid bruits or masses ?Cardiac:  RRR; no significant murmurs,  no rubs, or gallops ?Lungs:   CTA b/l, no wheezing, rhonchi or rales ?Abd: soft, nontender ?MS: no deformity or  atrophy ?Ext:  no edema ?Skin: warm and dry, no rash ?Neuro:  No gross deficits appreciated ?Psych: euthymic mood, full affect ? ? ? ?EKG:  not done today ? ? ?TTE 10/03/19  ? 1. Left ventricular ejection fraction, by estimation, is 60 to 65%. The  ?left ventricle has normal function. The left ventricle has no regional  ?wall motion abnormalities. There is mild left ventricular hypertrophy.  ?Left ventricular diastolic parameters  ?are indeterminate.  ? 2. Right ventricular systolic function is mildly reduced. The right  ?ventricular size is mildly enlarged. Tricuspid regurgitation signal is  ?inadequate for assessing PA pressure.  ? 3. Left atrial size was mild to moderately dilated.  ? 4. Right atrial size was mildly dilated.  ? 5. The mitral valve is normal in structure. Mild to moderate mitral valve  ?regurgitation. No evidence of mitral stenosis.  ? 6. The aortic valve is tricuspid. Aortic valve regurgitation is not  ?visualized. No aortic stenosis is present.  ? 7. Aortic dilatation noted. There is mild dilatation of the ascending  ?aorta measuring 39 mm.  ? 8. The inferior vena cava is normal in size with greater than 50%  ?respiratory variability, suggesting right atrial pressure of 3 mmHg.  ? 9. The patient was in atrial fibrillation.  ?  ?  ? ?Recent Labs: ?03/29/2021: B Natriuretic Peptide 195.2; BUN 17; Creatinine, Ser 1.20; Hemoglobin 15.1; Platelets 161; Potassium 4.7; Sodium 141  ?No results found for requested labs within last 8760 hours.  ? ?CrCl cannot be calculated (Patient's most recent lab result is older than the maximum 21 days allowed.).  ? ?Wt Readings from Last 3 Encounters:  ?09/29/21 192 lb (87.1 kg)  ?06/29/21 198 lb (89.8 kg)  ?04/29/21 188 lb 12.8 oz (85.6 kg)  ?  ? ?Other studies reviewed: ?Additional studies/records reviewed today include: summarized above ? ?ASSESSMENT AND  PLAN: ? ?Persistent Afib ?CHA2DS2Vasc is 2, on Eliquis, appropriately dosed ?no burden by symptoms ?Labs today ? ?HTN ?Typically better at home ? ? ?Disposition: F/u with [place] in 61mo sooner if needed ? ?Current medicines are reviewed at length with the patient today.  The patient did not have any concerns regarding medicines. ? ?Signed, ?RTommye Standard PA-C ?09/29/2021 10:09 AM    ? ?[facility] ?[address] ?[id number]?[phone] (office)  ?[phone](fax) ? ? ?

## 2021-09-29 ENCOUNTER — Ambulatory Visit (INDEPENDENT_AMBULATORY_CARE_PROVIDER_SITE_OTHER): Payer: Medicare Other | Admitting: Physician Assistant

## 2021-09-29 ENCOUNTER — Encounter: Payer: Self-pay | Admitting: Physician Assistant

## 2021-09-29 VITALS — BP 146/78 | HR 55 | Ht 66.0 in | Wt 192.0 lb

## 2021-09-29 DIAGNOSIS — I10 Essential (primary) hypertension: Secondary | ICD-10-CM | POA: Diagnosis not present

## 2021-09-29 DIAGNOSIS — Z79899 Other long term (current) drug therapy: Secondary | ICD-10-CM | POA: Diagnosis not present

## 2021-09-29 DIAGNOSIS — I4819 Other persistent atrial fibrillation: Secondary | ICD-10-CM

## 2021-09-29 LAB — BASIC METABOLIC PANEL
BUN/Creatinine Ratio: 15 (ref 10–24)
BUN: 18 mg/dL (ref 8–27)
CO2: 29 mmol/L (ref 20–29)
Calcium: 9.8 mg/dL (ref 8.6–10.2)
Chloride: 102 mmol/L (ref 96–106)
Creatinine, Ser: 1.19 mg/dL (ref 0.76–1.27)
Glucose: 105 mg/dL — ABNORMAL HIGH (ref 70–99)
Potassium: 3.6 mmol/L (ref 3.5–5.2)
Sodium: 142 mmol/L (ref 134–144)
eGFR: 66 mL/min/{1.73_m2} (ref >59)

## 2021-09-29 LAB — CBC
Hematocrit: 45.4 % (ref 37.5–51.0)
Hemoglobin: 16.1 g/dL (ref 13.0–17.7)
MCH: 33.9 pg — ABNORMAL HIGH (ref 26.6–33.0)
MCHC: 35.5 g/dL (ref 31.5–35.7)
MCV: 96 fL (ref 79–97)
Platelets: 202 10*3/uL (ref 150–450)
RBC: 4.75 x10E6/uL (ref 4.14–5.80)
RDW: 13 % (ref 11.6–15.4)
WBC: 6.6 10*3/uL (ref 3.4–10.8)

## 2021-09-29 NOTE — Patient Instructions (Signed)
Medication Instructions:  ? ?Your physician recommends that you continue on your current medications as directed. Please refer to the Current Medication list given to you today. ? ?*If you need a refill on your cardiac medications before your next appointment, please call your pharmacy* ? ? ?Lab Work: BMET AND CBC TODAY  ? ?If you have labs (blood work) drawn today and your tests are completely normal, you will receive your results only by: ?MyChart Message (if you have MyChart) OR ?A paper copy in the mail ?If you have any lab test that is abnormal or we need to change your treatment, we will call you to review the results. ? ? ?Testing/Procedures: NONE ORDERED  TODAY ? ? ? ? ?Follow-Up: ?At [facility], you and your health needs are our priority.  As part of our continuing mission to provide you with exceptional heart care, we have created designated Provider Care Teams.  These Care Teams include your primary Cardiologist (physician) and Advanced Practice Providers (APPs -  Physician Assistants and Nurse Practitioners) who all work together to provide you with the care you need, when you need it. ? ?We recommend signing up for the patient portal called "MyChart".  Sign up information is provided on this After Visit Summary.  MyChart is used to connect with patients for Virtual Visits (Telemedicine).  Patients are able to view lab/test results, encounter notes, upcoming appointments, etc.  Non-urgent messages can be sent to your provider as well.   ?To learn more about what you can do with MyChart, go to [email or web address].   ? ?Your next appointment:   ?6 month(s) ? ?The format for your next appointment:   ?In Person ? ?Provider:   ?[name], PA-C{ ?  ? ? ?Other Instructions ? ?

## 2022-02-17 DIAGNOSIS — H02834 Dermatochalasis of left upper eyelid: Secondary | ICD-10-CM | POA: Diagnosis not present

## 2022-02-17 DIAGNOSIS — H5203 Hypermetropia, bilateral: Secondary | ICD-10-CM | POA: Diagnosis not present

## 2022-02-17 DIAGNOSIS — H43813 Vitreous degeneration, bilateral: Secondary | ICD-10-CM | POA: Diagnosis not present

## 2022-02-17 DIAGNOSIS — H02831 Dermatochalasis of right upper eyelid: Secondary | ICD-10-CM | POA: Diagnosis not present

## 2022-03-23 ENCOUNTER — Other Ambulatory Visit: Payer: Self-pay | Admitting: Cardiology

## 2022-03-23 DIAGNOSIS — I4891 Unspecified atrial fibrillation: Secondary | ICD-10-CM

## 2022-03-24 NOTE — Telephone Encounter (Signed)
Prescription refill request for Eliquis received. Indication:Afib Last office visit:4/23 Scr:1.1 Age: 71 Weight:87.1 kg  Prescription refilled

## 2022-03-28 DIAGNOSIS — E782 Mixed hyperlipidemia: Secondary | ICD-10-CM | POA: Diagnosis not present

## 2022-03-28 DIAGNOSIS — M109 Gout, unspecified: Secondary | ICD-10-CM | POA: Diagnosis not present

## 2022-03-28 DIAGNOSIS — Z125 Encounter for screening for malignant neoplasm of prostate: Secondary | ICD-10-CM | POA: Diagnosis not present

## 2022-03-28 DIAGNOSIS — R7301 Impaired fasting glucose: Secondary | ICD-10-CM | POA: Diagnosis not present

## 2022-03-28 DIAGNOSIS — E039 Hypothyroidism, unspecified: Secondary | ICD-10-CM | POA: Diagnosis not present

## 2022-03-28 DIAGNOSIS — I10 Essential (primary) hypertension: Secondary | ICD-10-CM | POA: Diagnosis not present

## 2022-04-11 DIAGNOSIS — I10 Essential (primary) hypertension: Secondary | ICD-10-CM | POA: Diagnosis not present

## 2022-04-11 DIAGNOSIS — Z6831 Body mass index (BMI) 31.0-31.9, adult: Secondary | ICD-10-CM | POA: Diagnosis not present

## 2022-04-11 DIAGNOSIS — Z23 Encounter for immunization: Secondary | ICD-10-CM | POA: Diagnosis not present

## 2022-04-11 DIAGNOSIS — M109 Gout, unspecified: Secondary | ICD-10-CM | POA: Diagnosis not present

## 2022-04-11 DIAGNOSIS — D6869 Other thrombophilia: Secondary | ICD-10-CM | POA: Diagnosis not present

## 2022-04-11 DIAGNOSIS — E039 Hypothyroidism, unspecified: Secondary | ICD-10-CM | POA: Diagnosis not present

## 2022-04-11 DIAGNOSIS — E782 Mixed hyperlipidemia: Secondary | ICD-10-CM | POA: Diagnosis not present

## 2022-04-11 DIAGNOSIS — Z0000 Encounter for general adult medical examination without abnormal findings: Secondary | ICD-10-CM | POA: Diagnosis not present

## 2022-04-11 DIAGNOSIS — M79672 Pain in left foot: Secondary | ICD-10-CM | POA: Diagnosis not present

## 2022-04-11 DIAGNOSIS — I4892 Unspecified atrial flutter: Secondary | ICD-10-CM | POA: Diagnosis not present

## 2022-04-21 ENCOUNTER — Ambulatory Visit (INDEPENDENT_AMBULATORY_CARE_PROVIDER_SITE_OTHER): Payer: Medicare Other

## 2022-04-21 ENCOUNTER — Encounter: Payer: Self-pay | Admitting: Podiatry

## 2022-04-21 ENCOUNTER — Ambulatory Visit (INDEPENDENT_AMBULATORY_CARE_PROVIDER_SITE_OTHER): Payer: Medicare Other | Admitting: Podiatry

## 2022-04-21 DIAGNOSIS — L821 Other seborrheic keratosis: Secondary | ICD-10-CM | POA: Diagnosis not present

## 2022-04-21 DIAGNOSIS — M722 Plantar fascial fibromatosis: Secondary | ICD-10-CM | POA: Diagnosis not present

## 2022-04-21 DIAGNOSIS — M779 Enthesopathy, unspecified: Secondary | ICD-10-CM

## 2022-04-21 DIAGNOSIS — D2239 Melanocytic nevi of other parts of face: Secondary | ICD-10-CM | POA: Diagnosis not present

## 2022-04-21 DIAGNOSIS — L438 Other lichen planus: Secondary | ICD-10-CM | POA: Diagnosis not present

## 2022-04-21 DIAGNOSIS — D2272 Melanocytic nevi of left lower limb, including hip: Secondary | ICD-10-CM | POA: Diagnosis not present

## 2022-04-21 DIAGNOSIS — H61022 Chronic perichondritis of left external ear: Secondary | ICD-10-CM | POA: Diagnosis not present

## 2022-04-21 DIAGNOSIS — L578 Other skin changes due to chronic exposure to nonionizing radiation: Secondary | ICD-10-CM | POA: Diagnosis not present

## 2022-04-21 DIAGNOSIS — L814 Other melanin hyperpigmentation: Secondary | ICD-10-CM | POA: Diagnosis not present

## 2022-04-21 DIAGNOSIS — D2271 Melanocytic nevi of right lower limb, including hip: Secondary | ICD-10-CM | POA: Diagnosis not present

## 2022-04-21 MED ORDER — TRIAMCINOLONE ACETONIDE 10 MG/ML IJ SUSP
10.0000 mg | Freq: Once | INTRAMUSCULAR | Status: AC
  Administered 2022-04-21: 10 mg

## 2022-04-24 NOTE — Progress Notes (Unsigned)
Cardiology Office Note Date:  04/24/2022  Patient ID:  Eddie Thompson 06-17-1951, MRN [id number] PCP:  Eddie Cruel, MD  Electrophysiologist: Dr. Curt Bears     Chief Complaint:  3 mo  History of Present Illness: Eddie Thompson is a 71 y.o. male with history of HTN, HLD, Hypothyroidism, Afib  He come sin today to be seen for Dr. Curt Bears, last seen by him Jan 2023, he was doing well, did not think he had any AFib post ablation.  BP was up, though better at home. No changes were made.  I saw him 09/29/21 He is doing very well. His symptom with AFib was unusually SOB.  He does not think he has had any AF since his ablation Stays busy, watches 4 grand kids, always on the go, no formal exercise. He is retired from [employer], was also a Social research officer, government, still active as a chief   No CP, palpitations, SOB No near syncope or syncope. No bleeding Labs were updated, planned for 6 mo visit  *** symptoms *** burden *** bleeding, labs, eliquis   Afib Hx Diagnosed feb 2021 PVI ablation 03/24/21   Past Medical History:  Diagnosis Date   Actinic keratosis    Atrial flutter (HCC)    Gout    HTN (hypertension)    Hypothyroidism    Mixed hyperlipidemia    PONV (postoperative nausea and vomiting)     Past Surgical History:  Procedure Laterality Date   ATRIAL FIBRILLATION ABLATION N/A 03/24/2021   Procedure: ATRIAL FIBRILLATION ABLATION;  Surgeon: Constance Haw, MD;  Location: [place] [facility];  Service: Cardiovascular;  Laterality: N/A;   CARDIOVERSION N/A 11/15/2019   Procedure: CARDIOVERSION;  Surgeon: Buford Dresser, MD;  Location: [facility] ENDOSCOPY;  Service: Cardiovascular;  Laterality: N/A;   CARDIOVERSION N/A 07/24/2020   Procedure: CARDIOVERSION;  Surgeon: Skeet Latch, MD;  Location: [facility] ENDOSCOPY;  Service: Cardiovascular;  Laterality: N/A;    Current Outpatient Medications  Medication Sig Dispense Refill   acetaminophen  (TYLENOL) 325 MG tablet Take 325-650 mg by mouth every 6 (six) hours as needed for headache, moderate pain or mild pain.     allopurinol (ZYLOPRIM) 100 MG tablet Take 100 mg by mouth daily.     alum hydroxide-mag trisilicate (GAVISCON) 76-72 MG CHEW chewable tablet Chew 2 tablets by mouth daily as needed for indigestion or heartburn.     atorvastatin (LIPITOR) 40 MG tablet Take 40 mg by mouth daily.     DENTA 5000 PLUS 1.1 % CREA dental cream Take by mouth daily. Uses Tooth paste     ELIQUIS 5 MG TABS tablet TAKE 1 TABLET BY MOUTH TWICE A DAY 60 tablet 5   famotidine (PEPCID) 20 MG tablet Take 20 mg by mouth daily.     levothyroxine (SYNTHROID) 100 MCG tablet Take 100 mcg by mouth daily before breakfast. TAKE A EXTRA HALF MON  ONLY     losartan-hydrochlorothiazide (HYZAAR) 100-12.5 MG tablet Take 1 tablet by mouth daily.     Metoprolol Succinate 100 MG CS24 Take 100 mg by mouth daily.      Multiple Vitamin (MULTIVITAMIN) capsule Take 1 capsule by mouth daily.     Omega-3 Fatty Acids (FISH OIL) 1000 MG CAPS Take 1,000-2,000 mg by mouth See admin instructions. Alternate taking 1000 mg one day then 2000 mg the next     pyridOXINE (VITAMIN B-6) 100 MG tablet Take 100 mg by mouth daily.     No current facility-administered  medications for this visit.    Allergies:   Dolobid [diflunisal], Other, and Tetracyclines & related   Social History:  The patient  reports that he has never smoked. He has never used smokeless tobacco. He reports that he does not drink alcohol and does not use drugs.   Family History:  The patient's family history includes Heart attack in his father.  ROS:  Please see the history of present illness.    All other systems are reviewed and otherwise negative.   PHYSICAL EXAM:  VS:  There were no vitals taken for this visit. BMI: There is no height or weight on file to calculate BMI. Well nourished, well developed, in no acute distress HEENT: normocephalic, atraumatic Neck:  no JVD, carotid bruits or masses Cardiac:  *** RRR; no significant murmurs, no rubs, or gallops Lungs:   *** CTA b/l, no wheezing, rhonchi or rales Abd: soft, nontender MS: no deformity or  atrophy Ext:  *** no edema Skin: warm and dry, no rash Neuro:  No gross deficits appreciated Psych: euthymic mood, full affect    EKG:  not done today   TTE 10/03/19   1. Left ventricular ejection fraction, by estimation, is 60 to 65%. The  left ventricle has normal function. The left ventricle has no regional  wall motion abnormalities. There is mild left ventricular hypertrophy.  Left ventricular diastolic parameters  are indeterminate.   2. Right ventricular systolic function is mildly reduced. The right  ventricular size is mildly enlarged. Tricuspid regurgitation signal is  inadequate for assessing PA pressure.   3. Left atrial size was mild to moderately dilated.   4. Right atrial size was mildly dilated.   5. The mitral valve is normal in structure. Mild to moderate mitral valve  regurgitation. No evidence of mitral stenosis.   6. The aortic valve is tricuspid. Aortic valve regurgitation is not  visualized. No aortic stenosis is present.   7. Aortic dilatation noted. There is mild dilatation of the ascending  aorta measuring 39 mm.   8. The inferior vena cava is normal in size with greater than 50%  respiratory variability, suggesting right atrial pressure of 3 mmHg.   9. The patient was in atrial fibrillation.       Recent Labs: 09/29/2021: BUN 18; Creatinine, Ser 1.19; Hemoglobin 16.1; Platelets 202; Potassium 3.6; Sodium 142  No results found for requested labs within last 365 days.   CrCl cannot be calculated (Patient's most recent lab result is older than the maximum 21 days allowed.).   Wt Readings from Last 3 Encounters:  09/29/21 192 lb (87.1 kg)  06/29/21 198 lb (89.8 kg)  04/29/21 188 lb 12.8 oz (85.6 kg)     Other studies reviewed: Additional studies/records  reviewed today include: summarized above  ASSESSMENT AND PLAN:  Persistent Afib CHA2DS2Vasc is 2, on Eliquis, *** appropriately dosed *** *** no burden by symptoms Labs today  HTN *** Typically better at home   Disposition: ***  Current medicines are reviewed at length with the patient today.  The patient did not have any concerns regarding medicines.  Venetia Night, PA-C 04/24/2022 10:32 AM     [facility] [place] [address] [phone] (office)  [phone] (fax)

## 2022-04-24 NOTE — Progress Notes (Signed)
Subjective:   Patient ID: Eddie Thompson, male   DOB: 71 y.o.   MRN: [id number]   HPI Patient presents stating he has had pain in his left arch for about 4 months and states he is noted a small nodule within it that is painful in its own right   Review of Systems  All other systems reviewed and are negative.       Objective:  Physical Exam Vitals and nursing note reviewed.  Constitutional:      Appearance: He is well-developed.  Pulmonary:     Effort: Pulmonary effort is normal.  Musculoskeletal:        General: Normal range of motion.  Skin:    General: Skin is warm.  Neurological:     Mental Status: He is alert.     Neurovascular status intact muscle strength adequate range of motion adequate with patient found to have inflammation of the mid arch area left with pain with palpation.  Patient has good digital perfusion well-oriented x3     Assessment:  Mid arch Planter fasciitis left with inflammation fluid and pain with palpation     Plan:  H&P reviewed condition explained Planter fasciitis plantar nodule and went ahead today did sterile prep and injected the mid arch plantar fascia 3 mg Dexasone Kenalog 5 mg Xylocaine.  Reappoint to recheck  X-rays indicate that there is no signs of calcification or fracture associated with this

## 2022-04-25 ENCOUNTER — Encounter: Payer: Self-pay | Admitting: Physician Assistant

## 2022-04-25 ENCOUNTER — Ambulatory Visit: Payer: Medicare Other | Attending: Physician Assistant | Admitting: Physician Assistant

## 2022-04-25 VITALS — BP 120/82 | HR 56 | Ht 66.0 in | Wt 184.6 lb

## 2022-04-25 DIAGNOSIS — I10 Essential (primary) hypertension: Secondary | ICD-10-CM | POA: Diagnosis not present

## 2022-04-25 DIAGNOSIS — I4819 Other persistent atrial fibrillation: Secondary | ICD-10-CM | POA: Diagnosis not present

## 2022-04-25 NOTE — Patient Instructions (Signed)
Medication Instructions:   Your physician recommends that you continue on your current medications as directed. Please refer to the Current Medication list given to you today.  *If you need a refill on your cardiac medications before your next appointment, please call your pharmacy*   Lab Work: [place]   If you have labs (blood work) drawn today and your tests are completely normal, you will receive your results only by: [place] (if you have MyChart) OR A paper copy in the mail If you have any lab test that is abnormal or we need to change your treatment, we will call you to review the results.   Testing/Procedures: NONE ORDERED  TODAY     Follow-Up: At [facility], you and your health needs are our priority.  As part of our continuing mission to provide you with exceptional heart care, we have created designated Provider Care Teams.  These Care Teams include your primary Cardiologist (physician) and Advanced Practice Providers (APPs -  Physician Assistants and Nurse Practitioners) who all work together to provide you with the care you need, when you need it.  We recommend signing up for the patient portal called "MyChart".  Sign up information is provided on this After Visit Summary.  MyChart is used to connect with patients for Virtual Visits (Telemedicine).  Patients are able to view lab/test results, encounter notes, upcoming appointments, etc.  Non-urgent messages can be sent to your provider as well.   To learn more about what you can do with MyChart, go to [email or web address].    Your next appointment:    6 month(s)  The format for your next appointment:   In Person  Provider:   Allegra Lai, MD    Other Instructions   Important Information About [place]

## 2022-04-27 DIAGNOSIS — R030 Elevated blood-pressure reading, without diagnosis of hypertension: Secondary | ICD-10-CM | POA: Diagnosis not present

## 2022-04-27 DIAGNOSIS — J329 Chronic sinusitis, unspecified: Secondary | ICD-10-CM | POA: Diagnosis not present

## 2022-04-27 DIAGNOSIS — Z6831 Body mass index (BMI) 31.0-31.9, adult: Secondary | ICD-10-CM | POA: Diagnosis not present

## 2022-07-22 DIAGNOSIS — Z6831 Body mass index (BMI) 31.0-31.9, adult: Secondary | ICD-10-CM | POA: Diagnosis not present

## 2022-07-22 DIAGNOSIS — M25561 Pain in right knee: Secondary | ICD-10-CM | POA: Diagnosis not present

## 2022-07-22 DIAGNOSIS — R202 Paresthesia of skin: Secondary | ICD-10-CM | POA: Diagnosis not present

## 2022-08-17 DIAGNOSIS — M7541 Impingement syndrome of right shoulder: Secondary | ICD-10-CM | POA: Diagnosis not present

## 2022-08-17 DIAGNOSIS — M25521 Pain in right elbow: Secondary | ICD-10-CM | POA: Diagnosis not present

## 2022-08-17 DIAGNOSIS — S46111A Strain of muscle, fascia and tendon of long head of biceps, right arm, initial encounter: Secondary | ICD-10-CM | POA: Diagnosis not present

## 2022-08-30 NOTE — Progress Notes (Unsigned)
Electrophysiology Office Note   Date:  08/31/2022   ID:  Eddie Thompson, DOB September 02, 1950, MRN [id number]  PCP:  [name], MD  Cardiologist:  Harrington Challenger Primary Electrophysiologist:  Loring Liskey [name], MD    Chief Complaint: AF   History of Present Illness: Eddie Thompson is a 72 y.o. male who is being seen today for the evaluation of AF at the request of [name], MD. Presenting today for electrophysiology evaluation.  He has a history significant for atrial fibrillation, hypertension, hyperlipidemia, hypothyroidism.  Atrial fibrillation symptoms are palpitations, fatigue, shortness of breath.  He has had multiple cardioversions.  He is now status post atrial fibrillation ablation 03/24/2021.  Today, denies symptoms of palpitations, chest pain, shortness of breath, orthopnea, PND, lower extremity edema, claudication, dizziness, presyncope, syncope, bleeding, or neurologic sequela. The patient is tolerating medications without difficulties.  Since being seen he has done well.  He has had no chest pain or shortness of breath.  He is able to do all of his daily activities without restriction.  He is overall quite happy with his atrial fibrillation control.   Past Medical History:  Diagnosis Date   Actinic keratosis    Atrial flutter (HCC)    Gout    HTN (hypertension)    Hypothyroidism    Mixed hyperlipidemia    PONV (postoperative nausea and vomiting)    Past Surgical History:  Procedure Laterality Date   ATRIAL FIBRILLATION ABLATION N/A 03/24/2021   Procedure: ATRIAL FIBRILLATION ABLATION;  Surgeon: Constance Haw, MD;  Location: [place] CV LAB;  Service: Cardiovascular;  Laterality: N/A;   CARDIOVERSION N/A 11/15/2019   Procedure: CARDIOVERSION;  Surgeon: Buford Dresser, MD;  Location: [facility] ENDOSCOPY;  Service: Cardiovascular;  Laterality: N/A;   CARDIOVERSION N/A 07/24/2020   Procedure: CARDIOVERSION;  Surgeon: Skeet Latch, MD;  Location:  [facility] ENDOSCOPY;  Service: Cardiovascular;  Laterality: N/A;     Current Outpatient Medications  Medication Sig Dispense Refill   acetaminophen (TYLENOL) 325 MG tablet Take 325-650 mg by mouth every 6 (six) hours as needed for headache, moderate pain or mild pain.     allopurinol (ZYLOPRIM) 100 MG tablet Take 100 mg by mouth daily.     alum hydroxide-mag trisilicate (GAVISCON) [id number] MG CHEW chewable tablet Chew 2 tablets by mouth daily as needed for indigestion or heartburn.     atorvastatin (LIPITOR) 40 MG tablet Take 40 mg by mouth daily.     B Complex Vitamins (B COMPLEX 1 PO)      DENTA 5000 PLUS 1.1 % CREA dental cream Take by mouth daily. Uses Tooth paste     ELIQUIS 5 MG TABS tablet TAKE 1 TABLET BY MOUTH TWICE A DAY 60 tablet 5   famotidine (PEPCID) 20 MG tablet Take 20 mg by mouth daily.     levothyroxine (SYNTHROID) 100 MCG tablet Take 100 mcg by mouth daily before breakfast. TAKE A EXTRA HALF MON  ONLY     losartan-hydrochlorothiazide (HYZAAR) 100-12.5 MG tablet Take 1 tablet by mouth daily.     Metoprolol Succinate 100 MG CS24 Take 100 mg by mouth daily.      Multiple Vitamin (MULTIVITAMIN) capsule Take 1 capsule by mouth daily.     Omega-3 Fatty Acids (FISH OIL) 1000 MG CAPS Take 1,000-2,000 mg by mouth See admin instructions. Alternate taking 1000 mg one day then 2000 mg the next     pyridOXINE (VITAMIN B-6) 100 MG tablet Take 100 mg by mouth daily.  No current facility-administered medications for this visit.    Allergies:   Lovastatin, Dolobid [diflunisal], Other, and Tetracyclines & related   Social History:  The patient  reports that he has never smoked. He has never used smokeless tobacco. He reports that he does not drink alcohol and does not use drugs.   Family History:  The patient's family history includes Heart attack in his father.   ROS:  Please see the history of present illness.   Otherwise, review of systems is positive for none.   All other systems are  reviewed and negative.   PHYSICAL EXAM: VS:  BP (!) 144/84   Pulse (!) 57   Ht '5\' 6"'$  (1.676 m)   Wt 188 lb (85.3 kg)   SpO2 98%   BMI 30.34 kg/m  , BMI Body mass index is 30.34 kg/m. GEN: Well nourished, well developed, in no acute distress  HEENT: normal  Neck: no JVD, carotid bruits, or masses Cardiac: RRR; no murmurs, rubs, or gallops,no edema  Respiratory:  clear to auscultation bilaterally, normal work of breathing GI: soft, nontender, nondistended, + BS MS: no deformity or atrophy  Skin: warm and dry Neuro:  Strength and sensation are intact Psych: euthymic mood, full affect  EKG:  EKG is ordered today. Personal review of the ekg ordered shows sinus rhythm   Recent Labs: 09/29/2021: BUN 18; Creatinine, Ser 1.19; Hemoglobin 16.1; Platelets 202; Potassium 3.6; Sodium 142    Lipid Panel  No results found for: "CHOL", "TRIG", "HDL", "CHOLHDL", "VLDL", "LDLCALC", "LDLDIRECT"   Wt Readings from Last 3 Encounters:  08/31/22 188 lb (85.3 kg)  04/25/22 184 lb 9.6 oz (83.7 kg)  09/29/21 192 lb (87.1 kg)      Other studies Reviewed: Additional studies/ records that were reviewed today include: TTE 10/03/19  Review of the above records today demonstrates:   1. Left ventricular ejection fraction, by estimation, is 60 to 65%. The  left ventricle has normal function. The left ventricle has no regional  wall motion abnormalities. There is mild left ventricular hypertrophy.  Left ventricular diastolic parameters  are indeterminate.   2. Right ventricular systolic function is mildly reduced. The right  ventricular size is mildly enlarged. Tricuspid regurgitation signal is  inadequate for assessing PA pressure.   3. Left atrial size was mild to moderately dilated.   4. Right atrial size was mildly dilated.   5. The mitral valve is normal in structure. Mild to moderate mitral valve  regurgitation. No evidence of mitral stenosis.   6. The aortic valve is tricuspid. Aortic valve  regurgitation is not  visualized. No aortic stenosis is present.   7. Aortic dilatation noted. There is mild dilatation of the ascending  aorta measuring 39 mm.   8. The inferior vena cava is normal in size with greater than 50%  respiratory variability, suggesting right atrial pressure of 3 mmHg.   9. The patient was in atrial fibrillation.    ASSESSMENT AND PLAN:  1.  Persistent atrial fibrillation: Currently on Eliquis and metoprolol.  CHA2DS2-VASc of 2.  Status post ablation 03/24/2021.  Has remained in sinus rhythm.  Eddie Thompson continue with current management.  2.  Hypertension: Currently well-controlled  3.  Hyperlipidemia: Continue atorvastatin per primary physician  4.  Second hypercoagulable state: Currently on Eliquis for atrial fibrillation  Current medicines are reviewed at length with the patient today.   The patient does not have concerns regarding his medicines.  The following changes were made today: None  Labs/ tests ordered today include:  Orders Placed This Encounter  Procedures   Basic metabolic panel   CBC   EKG 12-Lead      Disposition:   FU 12 months  Signed, Yuma Blucher [name], MD  08/31/2022 8:22 AM     [facility] [address] [place] [address] [phone] (office) [phone] (fax)

## 2022-08-31 ENCOUNTER — Encounter: Payer: Self-pay | Admitting: Cardiology

## 2022-08-31 ENCOUNTER — Ambulatory Visit: Payer: Medicare Other | Attending: Cardiology | Admitting: Cardiology

## 2022-08-31 VITALS — BP 144/84 | HR 57 | Ht 66.0 in | Wt 188.0 lb

## 2022-08-31 DIAGNOSIS — I10 Essential (primary) hypertension: Secondary | ICD-10-CM

## 2022-08-31 DIAGNOSIS — I4819 Other persistent atrial fibrillation: Secondary | ICD-10-CM | POA: Diagnosis not present

## 2022-08-31 DIAGNOSIS — D6869 Other thrombophilia: Secondary | ICD-10-CM

## 2022-08-31 DIAGNOSIS — Z79899 Other long term (current) drug therapy: Secondary | ICD-10-CM | POA: Diagnosis not present

## 2022-08-31 NOTE — Patient Instructions (Signed)
Medication Instructions:  Your physician recommends that you continue on your current medications as directed. Please refer to the Current Medication list given to you today.  *If you need a refill on your cardiac medications before your next appointment, please call your pharmacy*   Lab Work: Eliquis surveillance labs today: BMET & CBC  If you have labs (blood work) drawn today and your tests are completely normal, you will receive your results only by: MyChart Message (if you have MyChart) OR A paper copy in the mail If you have any lab test that is abnormal or we need to change your treatment, we will call you to review the results.   Testing/Procedures: None ordered   Follow-Up: At [facility], you and your health needs are our priority.  As part of our continuing mission to provide you with exceptional heart care, we have created designated Provider Care Teams.  These Care Teams include your primary Cardiologist (physician) and Advanced Practice Providers (APPs -  Physician Assistants and Nurse Practitioners) who all work together to provide you with the care you need, when you need it.   Your next appointment:   1 year(s)  The format for your next appointment:   In Person  Provider:   Allegra Lai, MD    Thank you for choosing [facility]!!   Trinidad Curet, RN [phone]

## 2022-09-01 LAB — BASIC METABOLIC PANEL
BUN/Creatinine Ratio: 16 (ref 10–24)
BUN: 19 mg/dL (ref 8–27)
CO2: 27 mmol/L (ref 20–29)
Calcium: 10.1 mg/dL (ref 8.6–10.2)
Chloride: 104 mmol/L (ref 96–106)
Creatinine, Ser: 1.19 mg/dL (ref 0.76–1.27)
Glucose: 108 mg/dL — ABNORMAL HIGH (ref 70–99)
Potassium: 4.4 mmol/L (ref 3.5–5.2)
Sodium: 144 mmol/L (ref 134–144)
eGFR: 65 mL/min/{1.73_m2} (ref >59)

## 2022-09-01 LAB — CBC
Hematocrit: 52.1 % — ABNORMAL HIGH (ref 37.5–51.0)
Hemoglobin: 17.8 g/dL — ABNORMAL HIGH (ref 13.0–17.7)
MCH: 34.6 pg — ABNORMAL HIGH (ref 26.6–33.0)
MCHC: 34.2 g/dL (ref 31.5–35.7)
MCV: 101 fL — ABNORMAL HIGH (ref 79–97)
Platelets: 226 10*3/uL (ref 150–450)
RBC: 5.15 x10E6/uL (ref 4.14–5.80)
RDW: 13.2 % (ref 11.6–15.4)
WBC: 8.9 10*3/uL (ref 3.4–10.8)

## 2022-09-05 ENCOUNTER — Encounter: Payer: Self-pay | Admitting: Cardiology

## 2022-11-15 LAB — LAB REPORT - SCANNED: eGFR: 69

## 2022-11-22 ENCOUNTER — Telehealth: Payer: Self-pay | Admitting: Cardiology

## 2022-11-22 DIAGNOSIS — Z7901 Long term (current) use of anticoagulants: Secondary | ICD-10-CM | POA: Diagnosis not present

## 2022-11-22 DIAGNOSIS — Z86010 Personal history of colonic polyps: Secondary | ICD-10-CM | POA: Diagnosis not present

## 2022-11-22 NOTE — Telephone Encounter (Signed)
Patient stated he has a history of Afib, as of Sunday pt stated he is in Afib. Pt stated while checking his radial pulse he can feel his HR "going fast and slowing down", also when he takes a deep breath pt stated " I don't feel like my lungs are getting full of air". Pt is scheduled with MD on 5/29, explained ED  precautions, and pt voiced understanding.

## 2022-11-22 NOTE — Telephone Encounter (Signed)
Patient c/o Palpitations:  High priority if patient c/o lightheadedness, shortness of breath, or chest pain  How long have you had palpitations/irregular HR/ Afib? Are you having the symptoms now? He says he is Afib at this time  Are you currently experiencing lightheadedness, SOB or CP? no  Do you have a history of afib (atrial fibrillation) or irregular heart rhythm? yes  Have you checked your BP or HR? (document readings if available):    have not checked it today  Are you experiencing any other symptoms? No- patient wanted to be seen- I made him an appointment for 11-23-22- please call to evaluate

## 2022-11-23 ENCOUNTER — Ambulatory Visit: Payer: Medicare Other | Attending: Cardiology | Admitting: Cardiology

## 2022-11-23 ENCOUNTER — Encounter: Payer: Self-pay | Admitting: Cardiology

## 2022-11-23 VITALS — BP 140/86 | HR 58 | Ht 66.0 in | Wt 184.6 lb

## 2022-11-23 DIAGNOSIS — I10 Essential (primary) hypertension: Secondary | ICD-10-CM | POA: Insufficient documentation

## 2022-11-23 DIAGNOSIS — D6869 Other thrombophilia: Secondary | ICD-10-CM | POA: Insufficient documentation

## 2022-11-23 DIAGNOSIS — I4819 Other persistent atrial fibrillation: Secondary | ICD-10-CM | POA: Insufficient documentation

## 2022-11-23 NOTE — Progress Notes (Signed)
Electrophysiology Office Note   Date:  11/23/2022   ID:  Eddie Thompson, DOB 1950-09-08, MRN [id number]  PCP:  Daisy Floro, MD  Cardiologist:  Tenny Craw Primary Electrophysiologist:  Amethyst Gainer Jorja Loa, MD    Chief Complaint: AF   History of Present Illness: Eddie Thompson is a 72 y.o. male who is being seen today for the evaluation of AF at the request of Daisy Floro, MD. Presenting today for electrophysiology evaluation.  He has a history significant for atrial fibrillation, hypertension, hyperlipidemia, hypothyroidism.  Atrial fibrillation symptoms are palpitations, fatigue, shortness of breath.  He has had multiple cardioversions.  He is now status post atrial fibrillation ablation 03/24/2021.  Today, denies symptoms of chest pain, shortness of breath, orthopnea, PND, lower extremity edema, claudication, dizziness, presyncope, syncope, bleeding, or neurologic sequela. The patient is tolerating medications without difficulties.  The last couple days, he has had intermittent palpitations.  Palpitations occur at random times.  He states that he checked his pulse which was low in the 30s.  He feels a normal beat, strong beat, and then a pause.  Prior to that, he had no major complaints.  He does find it difficult to get a deep breath when he is having these episodes.   Past Medical History:  Diagnosis Date   Actinic keratosis    Atrial flutter (HCC)    Gout    HTN (hypertension)    Hypothyroidism    Mixed hyperlipidemia    PONV (postoperative nausea and vomiting)    Past Surgical History:  Procedure Laterality Date   ATRIAL FIBRILLATION ABLATION N/A 03/24/2021   Procedure: ATRIAL FIBRILLATION ABLATION;  Surgeon: Regan Lemming, MD;  Location: [facility] INVASIVE CV LAB;  Service: Cardiovascular;  Laterality: N/A;   CARDIOVERSION N/A 11/15/2019   Procedure: CARDIOVERSION;  Surgeon: Jodelle Red, MD;  Location: [facility] ENDOSCOPY;  Service: Cardiovascular;   Laterality: N/A;   CARDIOVERSION N/A 07/24/2020   Procedure: CARDIOVERSION;  Surgeon: Chilton Si, MD;  Location: [facility] ENDOSCOPY;  Service: Cardiovascular;  Laterality: N/A;     Current Outpatient Medications  Medication Sig Dispense Refill   acetaminophen (TYLENOL) 325 MG tablet Take 325-650 mg by mouth every 6 (six) hours as needed for headache, moderate pain or mild pain.     allopurinol (ZYLOPRIM) 100 MG tablet Take 100 mg by mouth daily.     alum hydroxide-mag trisilicate (GAVISCON) 80-20 MG CHEW chewable tablet Chew 2 tablets by mouth daily as needed for indigestion or heartburn.     atorvastatin (LIPITOR) 40 MG tablet Take 40 mg by mouth daily.     B Complex Vitamins (B COMPLEX 1 PO)      DENTA 5000 PLUS 1.1 % CREA dental cream Take by mouth daily. Uses Tooth paste     ELIQUIS 5 MG TABS tablet TAKE 1 TABLET BY MOUTH TWICE A DAY 60 tablet 5   famotidine (PEPCID) 20 MG tablet Take 20 mg by mouth daily.     levothyroxine (SYNTHROID) 100 MCG tablet Take 100 mcg by mouth daily before breakfast. TAKE A EXTRA HALF MON  ONLY     losartan-hydrochlorothiazide (HYZAAR) 100-12.5 MG tablet Take 1 tablet by mouth daily.     Metoprolol Succinate 100 MG CS24 Take 100 mg by mouth daily.      Multiple Vitamin (MULTIVITAMIN) capsule Take 1 capsule by mouth daily.     Omega-3 Fatty Acids (FISH OIL) 1000 MG CAPS Take 1,000-2,000 mg by mouth See admin instructions. Alternate taking 1000 mg  one day then 2000 mg the next     pyridOXINE (VITAMIN B-6) 100 MG tablet Take 100 mg by mouth daily.     No current facility-administered medications for this visit.    Allergies:   Lovastatin, Dolobid [diflunisal], Other, and Tetracyclines & related   Social History:  The patient  reports that he has never smoked. He has never used smokeless tobacco. He reports that he does not drink alcohol and does not use drugs.   Family History:  The patient's family history includes Heart attack in his father.   ROS:   Please see the history of present illness.   Otherwise, review of systems is positive for none.   All other systems are reviewed and negative.   PHYSICAL EXAM: VS:  BP (!) 140/86   Pulse (!) 58   Ht 5\' 6"  (1.676 m)   Wt 184 lb 9.6 oz (83.7 kg)   SpO2 99%   BMI 29.80 kg/m  , BMI Body mass index is 29.8 kg/m. GEN: Well nourished, well developed, in no acute distress  HEENT: normal  Neck: no JVD, carotid bruits, or masses Cardiac: RRR; no murmurs, rubs, or gallops,no edema  Respiratory:  clear to auscultation bilaterally, normal work of breathing GI: soft, nontender, nondistended, + BS MS: no deformity or atrophy  Skin: warm and dry Neuro:  Strength and sensation are intact Psych: euthymic mood, full affect  EKG:  EKG is ordered today. Personal review of the ekg ordered shows sinus rhythm   Recent Labs: 08/31/2022: BUN 19; Creatinine, Ser 1.19; Hemoglobin 17.8; Platelets 226; Potassium 4.4; Sodium 144    Lipid Panel  No results found for: "CHOL", "TRIG", "HDL", "CHOLHDL", "VLDL", "LDLCALC", "LDLDIRECT"   Wt Readings from Last 3 Encounters:  11/23/22 184 lb 9.6 oz (83.7 kg)  08/31/22 188 lb (85.3 kg)  04/25/22 184 lb 9.6 oz (83.7 kg)      Other studies Reviewed: Additional studies/ records that were reviewed today include: TTE 10/03/19  Review of the above records today demonstrates:   1. Left ventricular ejection fraction, by estimation, is 60 to 65%. The  left ventricle has normal function. The left ventricle has no regional  wall motion abnormalities. There is mild left ventricular hypertrophy.  Left ventricular diastolic parameters  are indeterminate.   2. Right ventricular systolic function is mildly reduced. The right  ventricular size is mildly enlarged. Tricuspid regurgitation signal is  inadequate for assessing PA pressure.   3. Left atrial size was mild to moderately dilated.   4. Right atrial size was mildly dilated.   5. The mitral valve is normal in  structure. Mild to moderate mitral valve  regurgitation. No evidence of mitral stenosis.   6. The aortic valve is tricuspid. Aortic valve regurgitation is not  visualized. No aortic stenosis is present.   7. Aortic dilatation noted. There is mild dilatation of the ascending  aorta measuring 39 mm.   8. The inferior vena cava is normal in size with greater than 50%  respiratory variability, suggesting right atrial pressure of 3 mmHg.   9. The patient was in atrial fibrillation.    ASSESSMENT AND PLAN:  1.  Persistent atrial fibrillation: Currently on Eliquis and metoprolol.  CHA2DS2-VASc of 2.  Status post ablation 03/24/2021.  I am not overly concerned that he is having episodes of atrial fibrillation.  His episodes do sound to be related more to PVCs.  He Melisia Leming get a cardia mobile for further monitoring.  If he  does have an elevated PVC burden, 2-week monitor may be beneficial.  2.  Hypertension:mildly elevated, plan per PCP  3.  Hyperlipidemia: Continue atorvastatin per primary physician  4.  Secondary hypercoagulable state: Currently on Eliquis for atrial fibrillation   Current medicines are reviewed at length with the patient today.   The patient does not have concerns regarding his medicines.  The following changes were made today: none  Labs/ tests ordered today include:  Orders Placed This Encounter  Procedures   EKG 12-Lead      Disposition:   FU 6 months  Signed, Shaleta Ruacho Jorja Loa, MD  11/23/2022 8:17 AM     [facility] [address] [place] [address] [phone] (office) [phone] (fax)

## 2022-12-01 ENCOUNTER — Encounter: Payer: Self-pay | Admitting: Cardiology

## 2022-12-06 DIAGNOSIS — Z0184 Encounter for antibody response examination: Secondary | ICD-10-CM | POA: Diagnosis not present

## 2022-12-06 DIAGNOSIS — Z6831 Body mass index (BMI) 31.0-31.9, adult: Secondary | ICD-10-CM | POA: Diagnosis not present

## 2022-12-07 ENCOUNTER — Encounter: Payer: Self-pay | Admitting: Cardiology

## 2023-02-22 DIAGNOSIS — H2513 Age-related nuclear cataract, bilateral: Secondary | ICD-10-CM | POA: Diagnosis not present

## 2023-02-22 DIAGNOSIS — H5203 Hypermetropia, bilateral: Secondary | ICD-10-CM | POA: Diagnosis not present

## 2023-02-22 DIAGNOSIS — H43813 Vitreous degeneration, bilateral: Secondary | ICD-10-CM | POA: Diagnosis not present

## 2023-04-06 DIAGNOSIS — Z125 Encounter for screening for malignant neoplasm of prostate: Secondary | ICD-10-CM | POA: Diagnosis not present

## 2023-04-06 DIAGNOSIS — R7303 Prediabetes: Secondary | ICD-10-CM | POA: Diagnosis not present

## 2023-04-06 DIAGNOSIS — E039 Hypothyroidism, unspecified: Secondary | ICD-10-CM | POA: Diagnosis not present

## 2023-04-06 DIAGNOSIS — I10 Essential (primary) hypertension: Secondary | ICD-10-CM | POA: Diagnosis not present

## 2023-04-06 DIAGNOSIS — E782 Mixed hyperlipidemia: Secondary | ICD-10-CM | POA: Diagnosis not present

## 2023-04-06 DIAGNOSIS — M109 Gout, unspecified: Secondary | ICD-10-CM | POA: Diagnosis not present

## 2023-04-18 DIAGNOSIS — Z6829 Body mass index (BMI) 29.0-29.9, adult: Secondary | ICD-10-CM | POA: Diagnosis not present

## 2023-04-18 DIAGNOSIS — I10 Essential (primary) hypertension: Secondary | ICD-10-CM | POA: Diagnosis not present

## 2023-04-18 DIAGNOSIS — I4892 Unspecified atrial flutter: Secondary | ICD-10-CM | POA: Diagnosis not present

## 2023-04-18 DIAGNOSIS — Z23 Encounter for immunization: Secondary | ICD-10-CM | POA: Diagnosis not present

## 2023-04-18 DIAGNOSIS — E782 Mixed hyperlipidemia: Secondary | ICD-10-CM | POA: Diagnosis not present

## 2023-04-18 DIAGNOSIS — Z0000 Encounter for general adult medical examination without abnormal findings: Secondary | ICD-10-CM | POA: Diagnosis not present

## 2023-04-18 DIAGNOSIS — R7303 Prediabetes: Secondary | ICD-10-CM | POA: Diagnosis not present

## 2023-04-18 DIAGNOSIS — M109 Gout, unspecified: Secondary | ICD-10-CM | POA: Diagnosis not present

## 2023-04-18 DIAGNOSIS — D6869 Other thrombophilia: Secondary | ICD-10-CM | POA: Diagnosis not present

## 2023-04-18 DIAGNOSIS — E039 Hypothyroidism, unspecified: Secondary | ICD-10-CM | POA: Diagnosis not present

## 2023-05-30 ENCOUNTER — Encounter: Payer: Self-pay | Admitting: Cardiology

## 2023-05-30 ENCOUNTER — Ambulatory Visit: Payer: Medicare Other | Attending: Cardiology | Admitting: Cardiology

## 2023-05-30 VITALS — BP 150/94 | HR 58 | Ht 66.0 in | Wt 178.8 lb

## 2023-05-30 DIAGNOSIS — D6869 Other thrombophilia: Secondary | ICD-10-CM | POA: Insufficient documentation

## 2023-05-30 DIAGNOSIS — I10 Essential (primary) hypertension: Secondary | ICD-10-CM | POA: Insufficient documentation

## 2023-05-30 DIAGNOSIS — I4819 Other persistent atrial fibrillation: Secondary | ICD-10-CM | POA: Diagnosis not present

## 2023-05-30 NOTE — Progress Notes (Signed)
  Electrophysiology Office Note:   Date:  05/30/2023  ID:  Eddie Thompson, DOB 01/04/1951, MRN [id number]  Primary Cardiologist: Dietrich Pates, MD Electrophysiologist: Regan Lemming, MD      History of Present Illness:   Eddie Thompson is a 72 y.o. male with h/o atrial fibrillation, hypertension, hyperlipidemia, hypothyroidism seen today for routine electrophysiology followup.   Since last being seen in our clinic the patient reports doing overall well.  He has no chest pain or shortness of breath.  He is able to do all of his daily activities.  He has a few palpitations that he attributes to PVCs.  He is not feel like he has been in atrial fibrillation.  he denies chest pain, palpitations, dyspnea, PND, orthopnea, nausea, vomiting, dizziness, syncope, edema, weight gain, or early satiety.   Review of systems complete and found to be negative unless listed in HPI.   EP Information / Studies Reviewed:    EKG is ordered today. Personal review as below.  EKG Interpretation Date/Time:  Tuesday May 30 2023 14:31:04 EST Ventricular Rate:  58 PR Interval:  200 QRS Duration:  86 QT Interval:  430 QTC Calculation: 422 R Axis:   -16  Text Interpretation: Sinus bradycardia Minimal voltage criteria for LVH, may be normal variant ( R in aVL ) When compared with ECG of 29-Apr-2021 08:59, No significant change was found Confirmed by Loren Sawaya ([id number]) on 05/30/2023 2:38:41 PM     Risk Assessment/Calculations:    CHA2DS2-VASc Score = 2   This indicates a 2.2% annual risk of stroke. The patient's score is based upon: CHF History: 0 HTN History: 1 Diabetes History: 0 Stroke History: 0 Vascular Disease History: 0 Age Score: 1 Gender Score: 0            Physical Exam:   VS:  BP (!) 150/94 (BP Location: Right Arm, Patient Position: Sitting, Cuff Size: Normal)   Pulse (!) 58   Ht 5\' 6"  (1.676 m)   Wt 178 lb 12.8 oz (81.1 kg)   SpO2 97%   BMI 28.86 kg/m    Wt Readings  from Last 3 Encounters:  05/30/23 178 lb 12.8 oz (81.1 kg)  11/23/22 184 lb 9.6 oz (83.7 kg)  08/31/22 188 lb (85.3 kg)     GEN: Well nourished, well developed in no acute distress NECK: No JVD; No carotid bruits CARDIAC: Regular rate and rhythm, no murmurs, rubs, gallops RESPIRATORY:  Clear to auscultation without rales, wheezing or rhonchi  ABDOMEN: Soft, non-tender, non-distended EXTREMITIES:  No edema; No deformity   ASSESSMENT AND PLAN:    1.  Persistent atrial fibrillation: Currently on metoprolol.  Post ablation 03/24/2021.  Had no further episodes of atrial fibrillation.  Happy with his control.  2.  Hypertension: Elevated today.  Has been more normal on prior checks.  Plan per primary cardiology and primary physician  3.  Hyperlipidemia: Continue atorvastatin per primary physician  4.  Secondary hypercoagulable state: Continue Eliquis for atrial fibrillation  Follow up with EP APP in 12 months  Signed, Delos Klich Jorja Loa, MD

## 2023-06-15 ENCOUNTER — Telehealth: Payer: Self-pay

## 2023-06-15 NOTE — Telephone Encounter (Signed)
   Pre-operative Risk Assessment    Patient Name: Eddie Thompson  DOB: 1951-05-22 MRN: [id number]     Request for Surgical Clearance    Procedure:  Colonoscopy   Date of Surgery:  Clearance 07/07/23                                 Surgeon:  Dr. Charlott Rakes  Surgeon's Group or Practice Name:  [facility]  Phone number:  [phone] Fax number:  [phone]   Type of Clearance Requested:   - Pharmacy:  Hold Apixaban (Eliquis)     Type of Anesthesia:  Propofol    Additional requests/questions:    Scarlette Shorts   06/15/2023, 4:49 PM

## 2023-06-15 NOTE — Telephone Encounter (Signed)
 Pharmacy please advise on holding Eliquis prior to colonoscopy scheduled for 07/07/2023. Thank you.

## 2023-06-16 NOTE — Telephone Encounter (Signed)
Patient with diagnosis of atrial fibrillation on Eliquis for anticoagulation.    Procedure:  Colonoscopy    Date of Surgery:  Clearance 07/07/23   CHA2DS2-VASc Score = 2   This indicates a 2.2% annual risk of stroke. The patient's score is based upon: CHF History: 0 HTN History: 1 Diabetes History: 0 Stroke History: 0 Vascular Disease History: 0 Age Score: 1 Gender Score: 0   CrCl 47 Platelet count 191  Per office protocol, patient can hold Eliquis for 2 days prior to procedure.   Patient will not need bridging with Lovenox (enoxaparin) around procedure.  **This guidance is not considered finalized until pre-operative APP has relayed final recommendations.**

## 2023-06-16 NOTE — Telephone Encounter (Signed)
   Patient Name: Eddie Thompson  DOB: 03-21-1951 MRN: [id number]  Primary Cardiologist: Dietrich Pates, MD Procedure: Colonoscopy on 07/07/23  Chart reviewed as part of pre-operative protocol coverage. Given past medical history and time since last visit, based on ACC/AHA guidelines, CLAUDIA [place] is at acceptable risk for the planned procedure without further cardiovascular testing.   Per office protocol, patient can hold Eliquis for 2 days prior to procedure, please resume when safe to do so from a bleeding standpoint.   Patient will not need bridging with Lovenox (enoxaparin) around procedure.  I will route this recommendation to the requesting party via Epic fax function and remove from pre-op pool.  Please call with questions.  Rip Harbour, NP 06/16/2023, 9:53 AM

## 2023-06-22 ENCOUNTER — Encounter: Payer: Self-pay | Admitting: Cardiology

## 2023-07-07 DIAGNOSIS — K648 Other hemorrhoids: Secondary | ICD-10-CM | POA: Diagnosis not present

## 2023-07-07 DIAGNOSIS — K5730 Diverticulosis of large intestine without perforation or abscess without bleeding: Secondary | ICD-10-CM | POA: Diagnosis not present

## 2023-07-07 DIAGNOSIS — Z09 Encounter for follow-up examination after completed treatment for conditions other than malignant neoplasm: Secondary | ICD-10-CM | POA: Diagnosis not present

## 2023-07-07 DIAGNOSIS — Z860100 Personal history of colon polyps, unspecified: Secondary | ICD-10-CM | POA: Diagnosis not present

## 2023-07-07 DIAGNOSIS — D122 Benign neoplasm of ascending colon: Secondary | ICD-10-CM | POA: Diagnosis not present

## 2023-07-11 DIAGNOSIS — D122 Benign neoplasm of ascending colon: Secondary | ICD-10-CM | POA: Diagnosis not present

## 2023-07-17 DIAGNOSIS — L570 Actinic keratosis: Secondary | ICD-10-CM | POA: Diagnosis not present

## 2023-08-09 DIAGNOSIS — M62411 Contracture of muscle, right shoulder: Secondary | ICD-10-CM | POA: Diagnosis not present

## 2023-08-09 DIAGNOSIS — Z6831 Body mass index (BMI) 31.0-31.9, adult: Secondary | ICD-10-CM | POA: Diagnosis not present

## 2023-08-25 DIAGNOSIS — J019 Acute sinusitis, unspecified: Secondary | ICD-10-CM | POA: Diagnosis not present

## 2023-09-11 IMAGING — CT CT HEART MORPH/PULM VEIN W/ CM & W/O CA SCORE
1 series · 2 of 2 positions shown, 3 images · non-contrast
Comparison: None.
COMPARISON: None.

Addendum:
EXAM:
OVER-READ INTERPRETATION  CT CHEST

The following report is an over-read performed by radiologist Dr.
Deisy Bjerke [[email or web address]] on 03/17/2021. This
over-read does not include interpretation of cardiac or coronary
anatomy or pathology. The coronary calcium score/coronary CTA
interpretation by the cardiologist is attached.
CLINICAL DATA: Atrial fibrillation scheduled for an ablation.
Cardiac CT/CTA
TECHNIQUE: The patient was scanned on a Siem[employer] Force [[email or web address]]ice  scanner.

[Series 2316: laa · 0.25mm/px · 2 of 2 slices shown, 3 images]
[im 1/2  vessel]
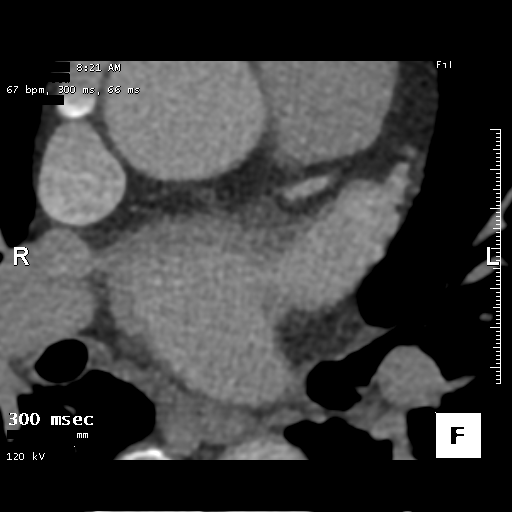
[im 1/2  lung]
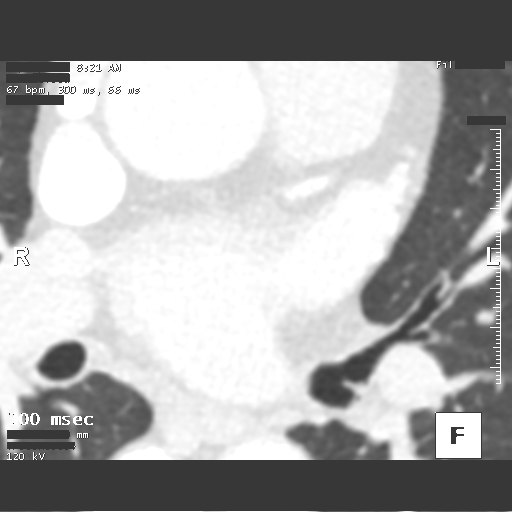
[im 2/2  vessel]
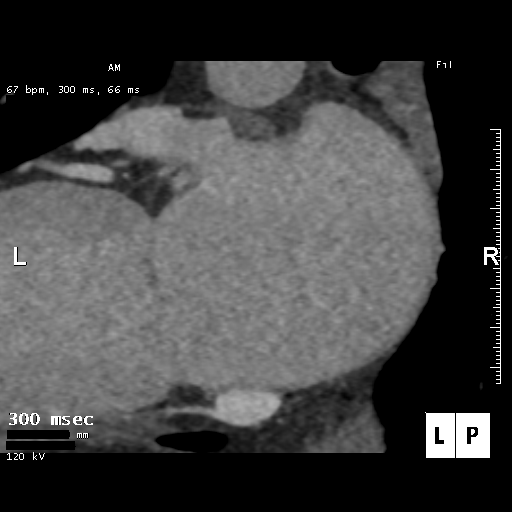

[2 of 2 positions shown; findings below may reference images not displayed]

FINDINGS: Within the visualized portions of the thorax there are no suspicious
appearing pulmonary nodules or masses, there is no acute
consolidative airspace disease, no pleural effusions, no
pneumothorax and no lymphadenopathy. Visualized portions of the
upper abdomen demonstrates a 1.3 x 0.9 cm low-attenuation lesion in
segment 2 of the liver, difficult to characterize on today's
arterial phase examination, but stable compared to the prior
examination and statistically likely a cyst. There are no aggressive
appearing lytic or blastic lesions noted in the visualized portions
of the skeleton.
IMPRESSION: No significant incidental noncardiac findings are noted.
FINDINGS: A 120 kV prospective scan was triggered in the ascending thoracic
aorta at 140 HU's. Gantry rotation speed was 250 msecs and
collimation was .6 mm. No beta blockade and no NTG was given. The 3D
data set was reconstructed for best systolic and diastolic phases
along with delayed images of the ROYER Images analyzed on a dedicated
work station using MPR, MIP and VRT modes. The patient received 80
cc of contrast.

Moderate bi atrial enlargement. No ROYER thrombus seen on delayed
images no ASD/PFO. No pericardial effusion Mild aortic root
dilatation 3.8 cm Normal PV anatomy dimensions below

RUPV: Ostium 19 mm   area 2.3 cm2

RLPV:  Ostium 15.1 mm  area 2.5 cm2

LUPV:  Ostium 17 mm  area 1.3 cm 2 Elliptical

LLPV:  Ostium 22 mm   area 2.6 cm2

Calcium score Calcium primarily in LAD Score 267 which is 61 st
percentile for age and sex
IMPRESSION: 1.  Moderate bi atrial enlargement

2.  No ROYER thrombus

3.  Normal PV anatomy with small right middle vein

4.  Mild aortic root dilatation 3.8 cm

5.  No pericardial effusion

6.  Calcium score 267 primarily in LAD 61 st percentile

Amaka Buchholz

*** End of Addendum ***
EXAM:
OVER-READ INTERPRETATION  CT CHEST

The following report is an over-read performed by radiologist Dr.
Deisy Bjerke [[email or web address]] on 03/17/2021. This
over-read does not include interpretation of cardiac or coronary
anatomy or pathology. The coronary calcium score/coronary CTA
interpretation by the cardiologist is attached.
FINDINGS: Within the visualized portions of the thorax there are no suspicious
appearing pulmonary nodules or masses, there is no acute
consolidative airspace disease, no pleural effusions, no
pneumothorax and no lymphadenopathy. Visualized portions of the
upper abdomen demonstrates a 1.3 x 0.9 cm low-attenuation lesion in
segment 2 of the liver, difficult to characterize on today's
arterial phase examination, but stable compared to the prior
examination and statistically likely a cyst. There are no aggressive
appearing lytic or blastic lesions noted in the visualized portions
of the skeleton.
IMPRESSION: No significant incidental noncardiac findings are noted.

## 2023-09-23 IMAGING — DX DG CHEST 2V
2 series · 2 of 2 positions shown · non-contrast
Comparison: 08/22/2019, 03/17/2021

CLINICAL DATA: Shortness of breath following cardiac ablation 6
days ago. Left lower chest pain

EXAM:
CHEST - 2 VIEW

[chest pa]
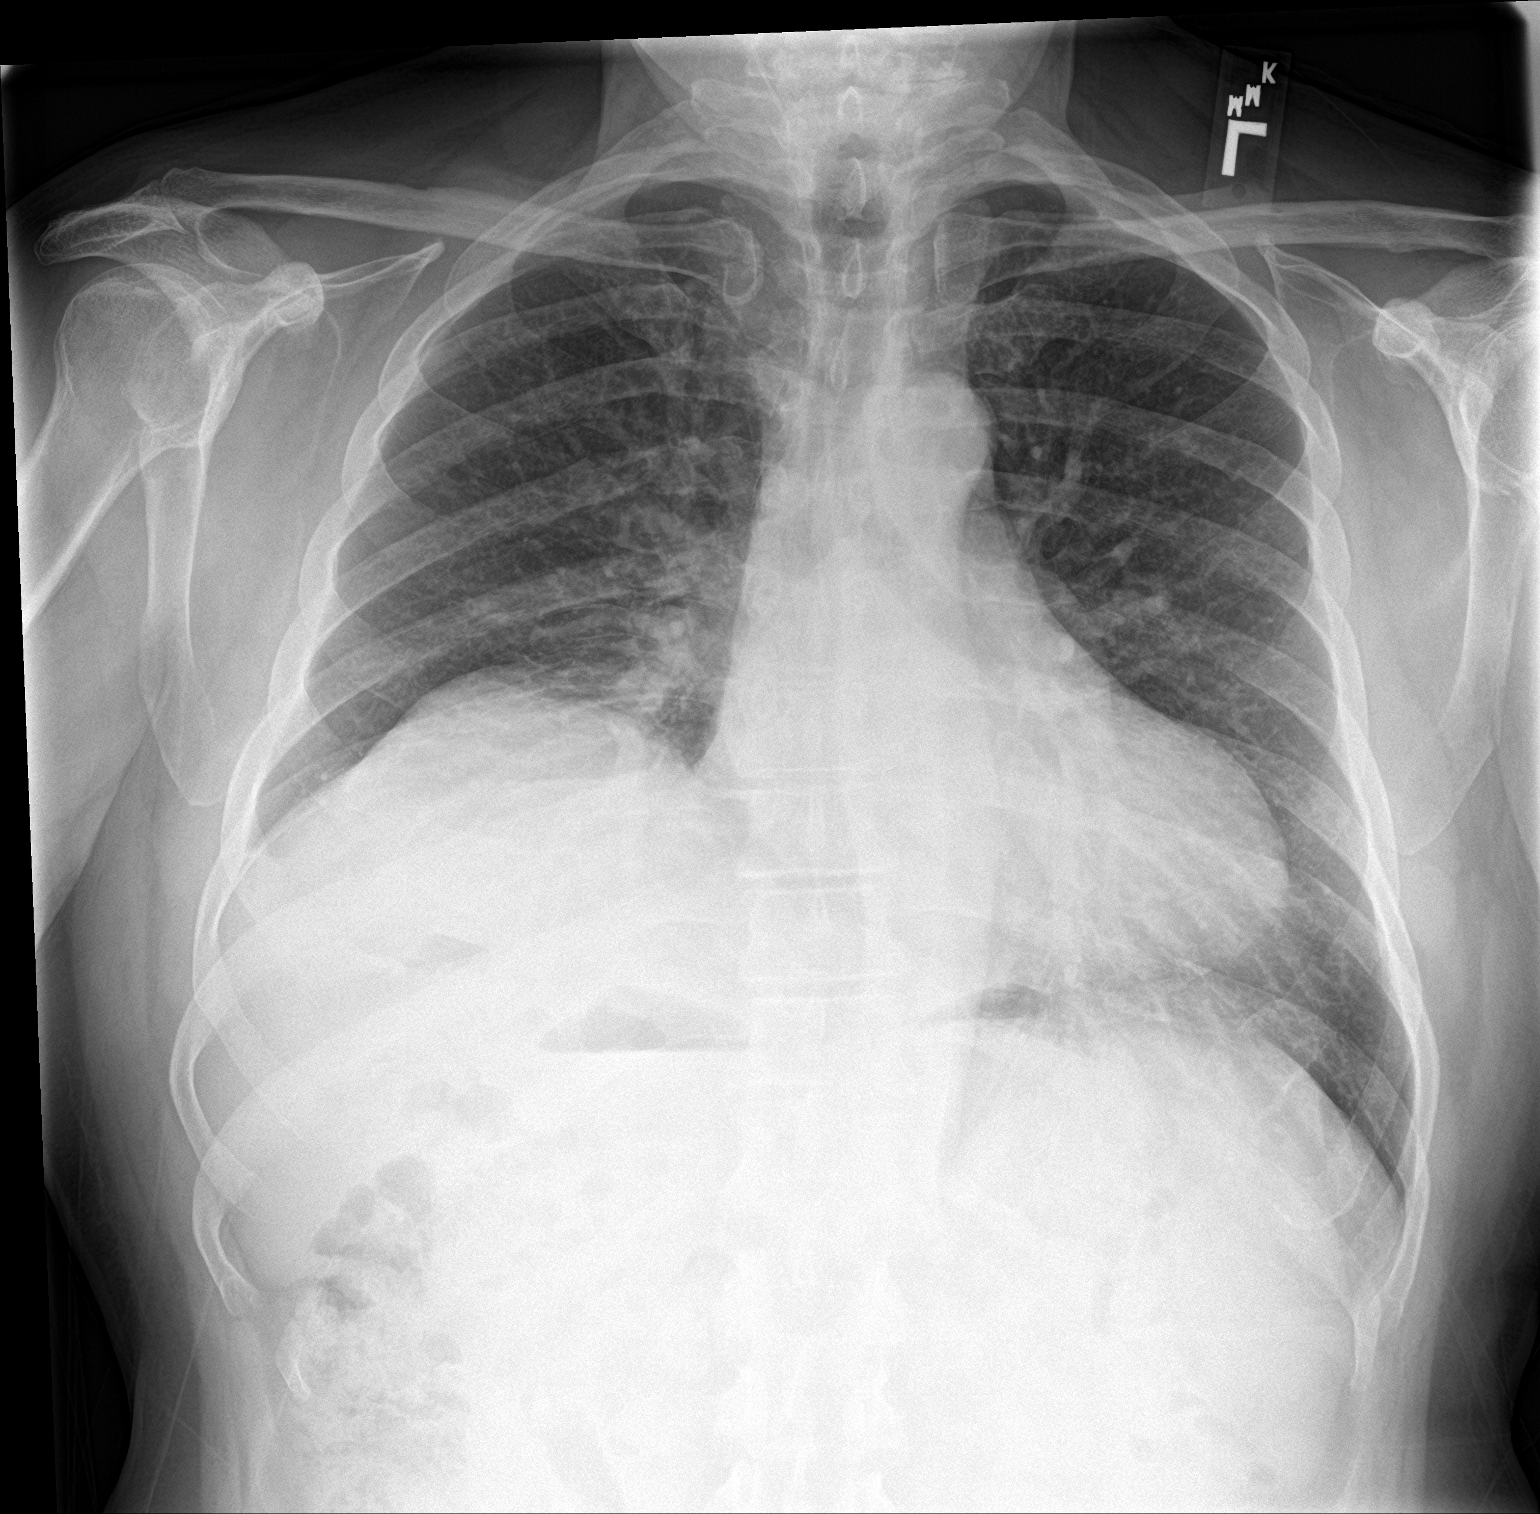

[chest lat]
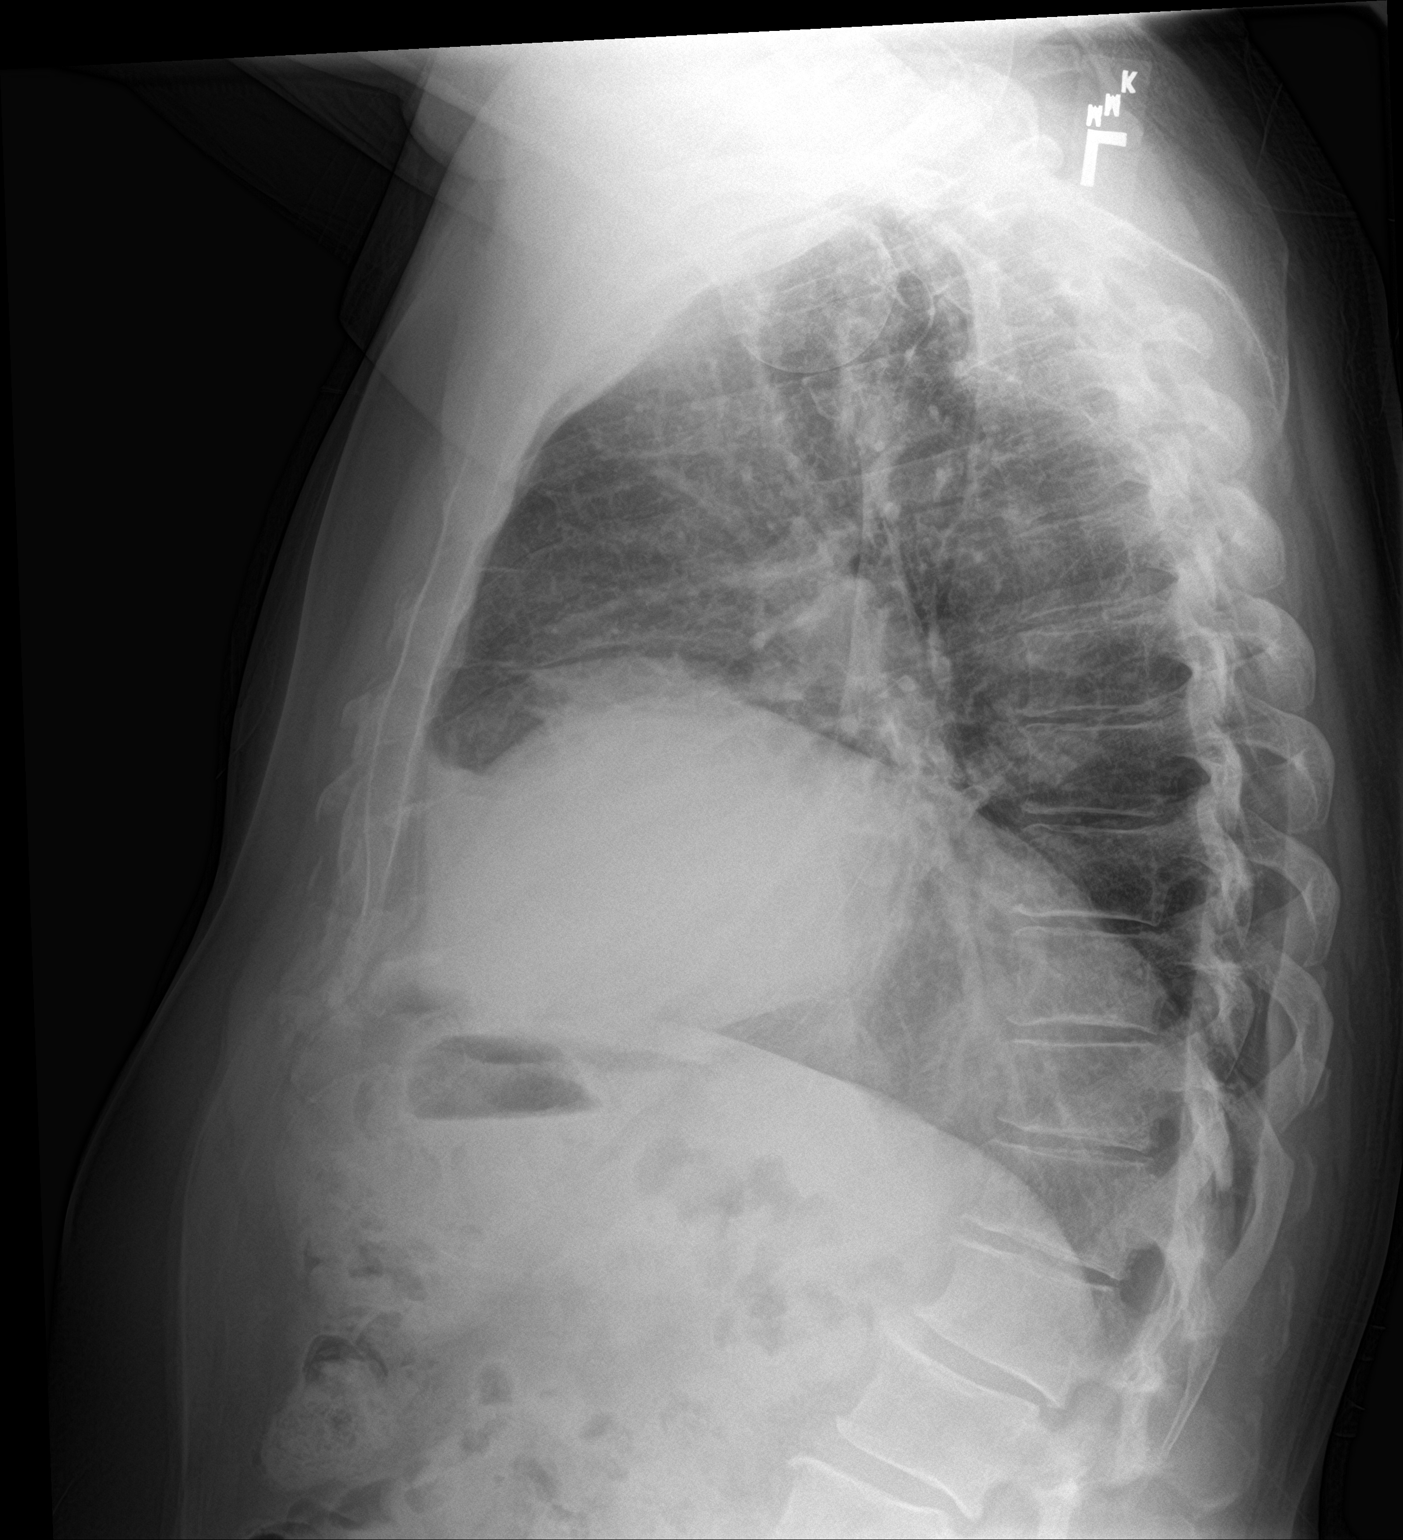

[2 of 2 positions shown; findings below may reference images not displayed]

FINDINGS: Heart size is normal. Elevation of the right hemidiaphragm, similar
to the prior CT. No focal airspace consolidation, pleural effusion,
or pneumothorax.
IMPRESSION: No active cardiopulmonary disease.

## 2023-10-10 DIAGNOSIS — H903 Sensorineural hearing loss, bilateral: Secondary | ICD-10-CM | POA: Diagnosis not present

## 2023-10-10 DIAGNOSIS — H90A21 Sensorineural hearing loss, unilateral, right ear, with restricted hearing on the contralateral side: Secondary | ICD-10-CM | POA: Diagnosis not present

## 2023-10-10 DIAGNOSIS — H9312 Tinnitus, left ear: Secondary | ICD-10-CM | POA: Diagnosis not present

## 2023-10-10 DIAGNOSIS — H90A32 Mixed conductive and sensorineural hearing loss, unilateral, left ear with restricted hearing on the contralateral side: Secondary | ICD-10-CM | POA: Diagnosis not present

## 2023-10-10 DIAGNOSIS — H6122 Impacted cerumen, left ear: Secondary | ICD-10-CM | POA: Diagnosis not present

## 2023-11-18 DIAGNOSIS — S81012A Laceration without foreign body, left knee, initial encounter: Secondary | ICD-10-CM | POA: Diagnosis not present

## 2023-11-20 DIAGNOSIS — S81012D Laceration without foreign body, left knee, subsequent encounter: Secondary | ICD-10-CM | POA: Diagnosis not present

## 2023-11-20 DIAGNOSIS — L089 Local infection of the skin and subcutaneous tissue, unspecified: Secondary | ICD-10-CM | POA: Diagnosis not present

## 2023-11-24 DIAGNOSIS — L089 Local infection of the skin and subcutaneous tissue, unspecified: Secondary | ICD-10-CM | POA: Diagnosis not present

## 2023-11-24 DIAGNOSIS — S81012D Laceration without foreign body, left knee, subsequent encounter: Secondary | ICD-10-CM | POA: Diagnosis not present

## 2023-12-04 DIAGNOSIS — Z4802 Encounter for removal of sutures: Secondary | ICD-10-CM | POA: Diagnosis not present

## 2024-02-27 DIAGNOSIS — H02831 Dermatochalasis of right upper eyelid: Secondary | ICD-10-CM | POA: Diagnosis not present

## 2024-02-27 DIAGNOSIS — H02834 Dermatochalasis of left upper eyelid: Secondary | ICD-10-CM | POA: Diagnosis not present

## 2024-02-27 DIAGNOSIS — H5203 Hypermetropia, bilateral: Secondary | ICD-10-CM | POA: Diagnosis not present

## 2024-02-27 DIAGNOSIS — H524 Presbyopia: Secondary | ICD-10-CM | POA: Diagnosis not present

## 2024-02-27 DIAGNOSIS — H2513 Age-related nuclear cataract, bilateral: Secondary | ICD-10-CM | POA: Diagnosis not present

## 2024-03-05 DIAGNOSIS — R059 Cough, unspecified: Secondary | ICD-10-CM | POA: Diagnosis not present

## 2024-03-05 DIAGNOSIS — Z6830 Body mass index (BMI) 30.0-30.9, adult: Secondary | ICD-10-CM | POA: Diagnosis not present

## 2024-04-10 DIAGNOSIS — Z23 Encounter for immunization: Secondary | ICD-10-CM | POA: Diagnosis not present

## 2024-05-06 DIAGNOSIS — E039 Hypothyroidism, unspecified: Secondary | ICD-10-CM | POA: Diagnosis not present

## 2024-05-06 DIAGNOSIS — I10 Essential (primary) hypertension: Secondary | ICD-10-CM | POA: Diagnosis not present

## 2024-05-06 DIAGNOSIS — E782 Mixed hyperlipidemia: Secondary | ICD-10-CM | POA: Diagnosis not present

## 2024-05-06 DIAGNOSIS — R7303 Prediabetes: Secondary | ICD-10-CM | POA: Diagnosis not present

## 2024-05-06 DIAGNOSIS — Z125 Encounter for screening for malignant neoplasm of prostate: Secondary | ICD-10-CM | POA: Diagnosis not present

## 2024-05-06 DIAGNOSIS — M109 Gout, unspecified: Secondary | ICD-10-CM | POA: Diagnosis not present

## 2024-05-06 DIAGNOSIS — Z7901 Long term (current) use of anticoagulants: Secondary | ICD-10-CM | POA: Diagnosis not present

## 2024-05-08 DIAGNOSIS — E039 Hypothyroidism, unspecified: Secondary | ICD-10-CM | POA: Diagnosis not present

## 2024-05-08 DIAGNOSIS — M109 Gout, unspecified: Secondary | ICD-10-CM | POA: Diagnosis not present

## 2024-05-08 DIAGNOSIS — R7303 Prediabetes: Secondary | ICD-10-CM | POA: Diagnosis not present

## 2024-05-08 DIAGNOSIS — E782 Mixed hyperlipidemia: Secondary | ICD-10-CM | POA: Diagnosis not present

## 2024-05-08 DIAGNOSIS — Z6831 Body mass index (BMI) 31.0-31.9, adult: Secondary | ICD-10-CM | POA: Diagnosis not present

## 2024-05-08 DIAGNOSIS — I10 Essential (primary) hypertension: Secondary | ICD-10-CM | POA: Diagnosis not present

## 2024-05-08 DIAGNOSIS — Z0000 Encounter for general adult medical examination without abnormal findings: Secondary | ICD-10-CM | POA: Diagnosis not present

## 2024-05-08 DIAGNOSIS — I4892 Unspecified atrial flutter: Secondary | ICD-10-CM | POA: Diagnosis not present

## 2024-05-08 DIAGNOSIS — D6869 Other thrombophilia: Secondary | ICD-10-CM | POA: Diagnosis not present

## 2024-05-08 DIAGNOSIS — Z125 Encounter for screening for malignant neoplasm of prostate: Secondary | ICD-10-CM | POA: Diagnosis not present

## 2024-09-30 ENCOUNTER — Ambulatory Visit: Admitting: Cardiology
